# Patient Record
Sex: Female | Born: 1950 | Race: White | Hispanic: No | Marital: Single | Smoking: Former smoker
Health system: Southern US, Community
[De-identification: ages and names within clinical notes are randomized; demographics above are authoritative.]

## PROBLEM LIST (undated history)

## (undated) DIAGNOSIS — F419 Anxiety disorder, unspecified: Secondary | ICD-10-CM

## (undated) DIAGNOSIS — I10 Essential (primary) hypertension: Secondary | ICD-10-CM

## (undated) DIAGNOSIS — M1990 Unspecified osteoarthritis, unspecified site: Secondary | ICD-10-CM

## (undated) DIAGNOSIS — E785 Hyperlipidemia, unspecified: Secondary | ICD-10-CM

## (undated) HISTORY — PX: COLONOSCOPY W/ POLYPECTOMY: SHX1380

## (undated) HISTORY — PX: NO PAST SURGERIES: SHX2092

---

## 1998-12-09 ENCOUNTER — Other Ambulatory Visit: Admission: RE | Admit: 1998-12-09 | Discharge: 1998-12-09 | Payer: Self-pay | Admitting: Gynecology

## 1999-01-12 ENCOUNTER — Other Ambulatory Visit: Admission: RE | Admit: 1999-01-12 | Discharge: 1999-01-12 | Payer: Self-pay | Admitting: Gynecology

## 1999-04-30 ENCOUNTER — Other Ambulatory Visit: Admission: RE | Admit: 1999-04-30 | Discharge: 1999-04-30 | Payer: Self-pay | Admitting: Gynecology

## 1999-04-30 ENCOUNTER — Encounter (INDEPENDENT_AMBULATORY_CARE_PROVIDER_SITE_OTHER): Payer: Self-pay | Admitting: Specialist

## 1999-09-09 ENCOUNTER — Other Ambulatory Visit: Admission: RE | Admit: 1999-09-09 | Discharge: 1999-09-09 | Payer: Self-pay | Admitting: Gynecology

## 2000-03-03 ENCOUNTER — Other Ambulatory Visit: Admission: RE | Admit: 2000-03-03 | Discharge: 2000-03-03 | Payer: Self-pay | Admitting: Gynecology

## 2001-08-02 ENCOUNTER — Other Ambulatory Visit: Admission: RE | Admit: 2001-08-02 | Discharge: 2001-08-02 | Payer: Self-pay | Admitting: Family Medicine

## 2002-10-18 ENCOUNTER — Other Ambulatory Visit: Admission: RE | Admit: 2002-10-18 | Discharge: 2002-10-18 | Payer: Self-pay | Admitting: Family Medicine

## 2004-09-01 ENCOUNTER — Other Ambulatory Visit: Admission: RE | Admit: 2004-09-01 | Discharge: 2004-09-01 | Payer: Self-pay | Admitting: Family Medicine

## 2005-09-06 ENCOUNTER — Other Ambulatory Visit: Admission: RE | Admit: 2005-09-06 | Discharge: 2005-09-06 | Payer: Self-pay | Admitting: Family Medicine

## 2009-08-20 ENCOUNTER — Ambulatory Visit: Payer: Self-pay | Admitting: Oncology

## 2009-09-14 LAB — CHCC SMEAR

## 2009-09-14 LAB — CBC WITH DIFFERENTIAL/PLATELET
Eosinophils Absolute: 0.1 10*3/uL (ref 0.0–0.5)
HCT: 37.9 % (ref 34.8–46.6)
LYMPH%: 37 % (ref 14.0–49.7)
MCHC: 35.1 g/dL (ref 31.5–36.0)
MCV: 93.9 fL (ref 79.5–101.0)
MONO#: 0.3 10*3/uL (ref 0.1–0.9)
MONO%: 7.7 % (ref 0.0–14.0)
NEUT#: 2.1 10*3/uL (ref 1.5–6.5)
NEUT%: 51.2 % (ref 38.4–76.8)
Platelets: 243 10*3/uL (ref 145–400)
RBC: 4.04 10*6/uL (ref 3.70–5.45)

## 2009-09-14 LAB — COMPREHENSIVE METABOLIC PANEL
ALT: 26 U/L (ref 0–35)
Albumin: 4 g/dL (ref 3.5–5.2)
CO2: 28 mEq/L (ref 19–32)
Calcium: 9.3 mg/dL (ref 8.4–10.5)
Chloride: 105 mEq/L (ref 96–112)
Glucose, Bld: 107 mg/dL — ABNORMAL HIGH (ref 70–99)
Sodium: 138 mEq/L (ref 135–145)
Total Bilirubin: 0.7 mg/dL (ref 0.3–1.2)
Total Protein: 6.6 g/dL (ref 6.0–8.3)

## 2009-09-14 LAB — MORPHOLOGY

## 2009-09-16 LAB — PROTEIN ELECTROPHORESIS, SERUM
Albumin ELP: 64.1 % (ref 55.8–66.1)
Alpha-1-Globulin: 3.5 % (ref 2.9–4.9)
Beta 2: 4.3 % (ref 3.2–6.5)
Beta Globulin: 6.2 % (ref 4.7–7.2)

## 2010-01-08 ENCOUNTER — Ambulatory Visit: Payer: Self-pay | Admitting: Oncology

## 2010-02-10 ENCOUNTER — Ambulatory Visit: Payer: Self-pay | Admitting: Oncology

## 2010-02-11 LAB — CBC WITH DIFFERENTIAL/PLATELET
Basophils Absolute: 0 10*3/uL (ref 0.0–0.1)
Eosinophils Absolute: 0.2 10*3/uL (ref 0.0–0.5)
HGB: 13.6 g/dL (ref 11.6–15.9)
LYMPH%: 46.1 % (ref 14.0–49.7)
MCV: 91.5 fL (ref 79.5–101.0)
MONO#: 0.3 10*3/uL (ref 0.1–0.9)
MONO%: 7.6 % (ref 0.0–14.0)
NEUT#: 1.5 10*3/uL (ref 1.5–6.5)
Platelets: 202 10*3/uL (ref 145–400)
WBC: 3.8 10*3/uL — ABNORMAL LOW (ref 3.9–10.3)

## 2010-05-11 ENCOUNTER — Ambulatory Visit: Payer: Self-pay | Admitting: Oncology

## 2011-01-04 ENCOUNTER — Other Ambulatory Visit: Payer: Self-pay | Admitting: Family Medicine

## 2011-01-04 ENCOUNTER — Other Ambulatory Visit (HOSPITAL_COMMUNITY)
Admission: RE | Admit: 2011-01-04 | Discharge: 2011-01-04 | Disposition: A | Discharge status: Home / Self Care | Payer: BC Managed Care – PPO | Source: Ambulatory Visit | Attending: Family Medicine | Admitting: Family Medicine

## 2011-01-04 DIAGNOSIS — Z124 Encounter for screening for malignant neoplasm of cervix: Secondary | ICD-10-CM | POA: Insufficient documentation

## 2012-03-06 ENCOUNTER — Other Ambulatory Visit: Payer: Self-pay | Admitting: Family Medicine

## 2012-03-06 ENCOUNTER — Other Ambulatory Visit (HOSPITAL_COMMUNITY)
Admission: RE | Admit: 2012-03-06 | Discharge: 2012-03-06 | Disposition: A | Discharge status: Home / Self Care | Payer: BC Managed Care – PPO | Source: Ambulatory Visit | Attending: Family Medicine | Admitting: Family Medicine

## 2012-03-06 DIAGNOSIS — Z124 Encounter for screening for malignant neoplasm of cervix: Secondary | ICD-10-CM | POA: Insufficient documentation

## 2013-10-29 ENCOUNTER — Other Ambulatory Visit: Payer: Self-pay | Admitting: Physician Assistant

## 2013-10-29 ENCOUNTER — Ambulatory Visit
Admission: RE | Admit: 2013-10-29 | Discharge: 2013-10-29 | Disposition: A | Discharge status: Home / Self Care | Payer: BC Managed Care – PPO | Source: Ambulatory Visit | Attending: Physician Assistant | Admitting: Physician Assistant

## 2013-10-29 DIAGNOSIS — M25551 Pain in right hip: Secondary | ICD-10-CM

## 2013-12-11 ENCOUNTER — Ambulatory Visit: Payer: BC Managed Care – PPO | Admitting: Sports Medicine

## 2014-01-28 ENCOUNTER — Ambulatory Visit: Payer: BC Managed Care – PPO | Admitting: Sports Medicine

## 2015-01-11 IMAGING — CR DG HIP (WITH OR WITHOUT PELVIS) 2-3V*L*
2 series · 2 of 2 positions shown · non-contrast
Comparison: None.

CLINICAL DATA: Pain

EXAM:
LEFT HIP - COMPLETE 2+ VIEW

[t hip ap left]
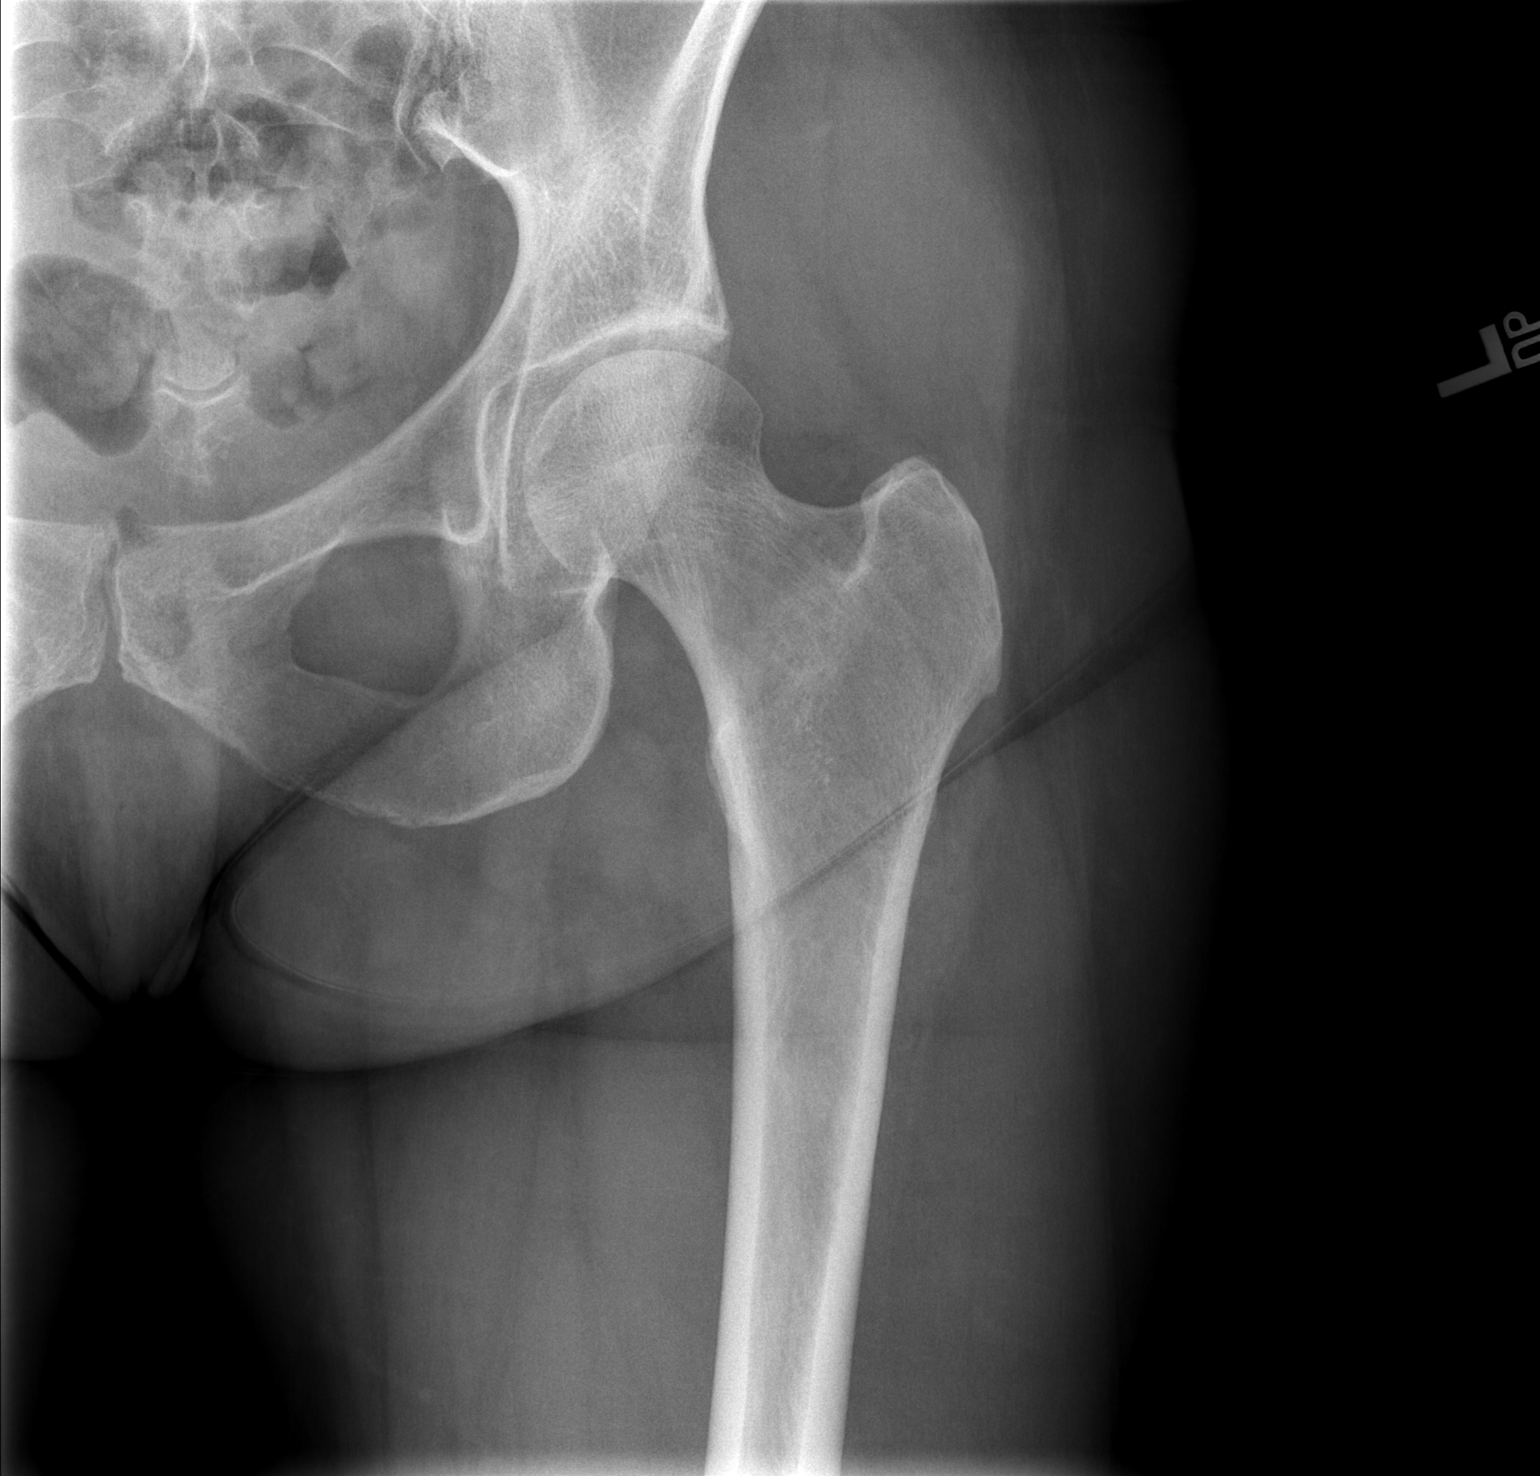

[t hip frog leg left]
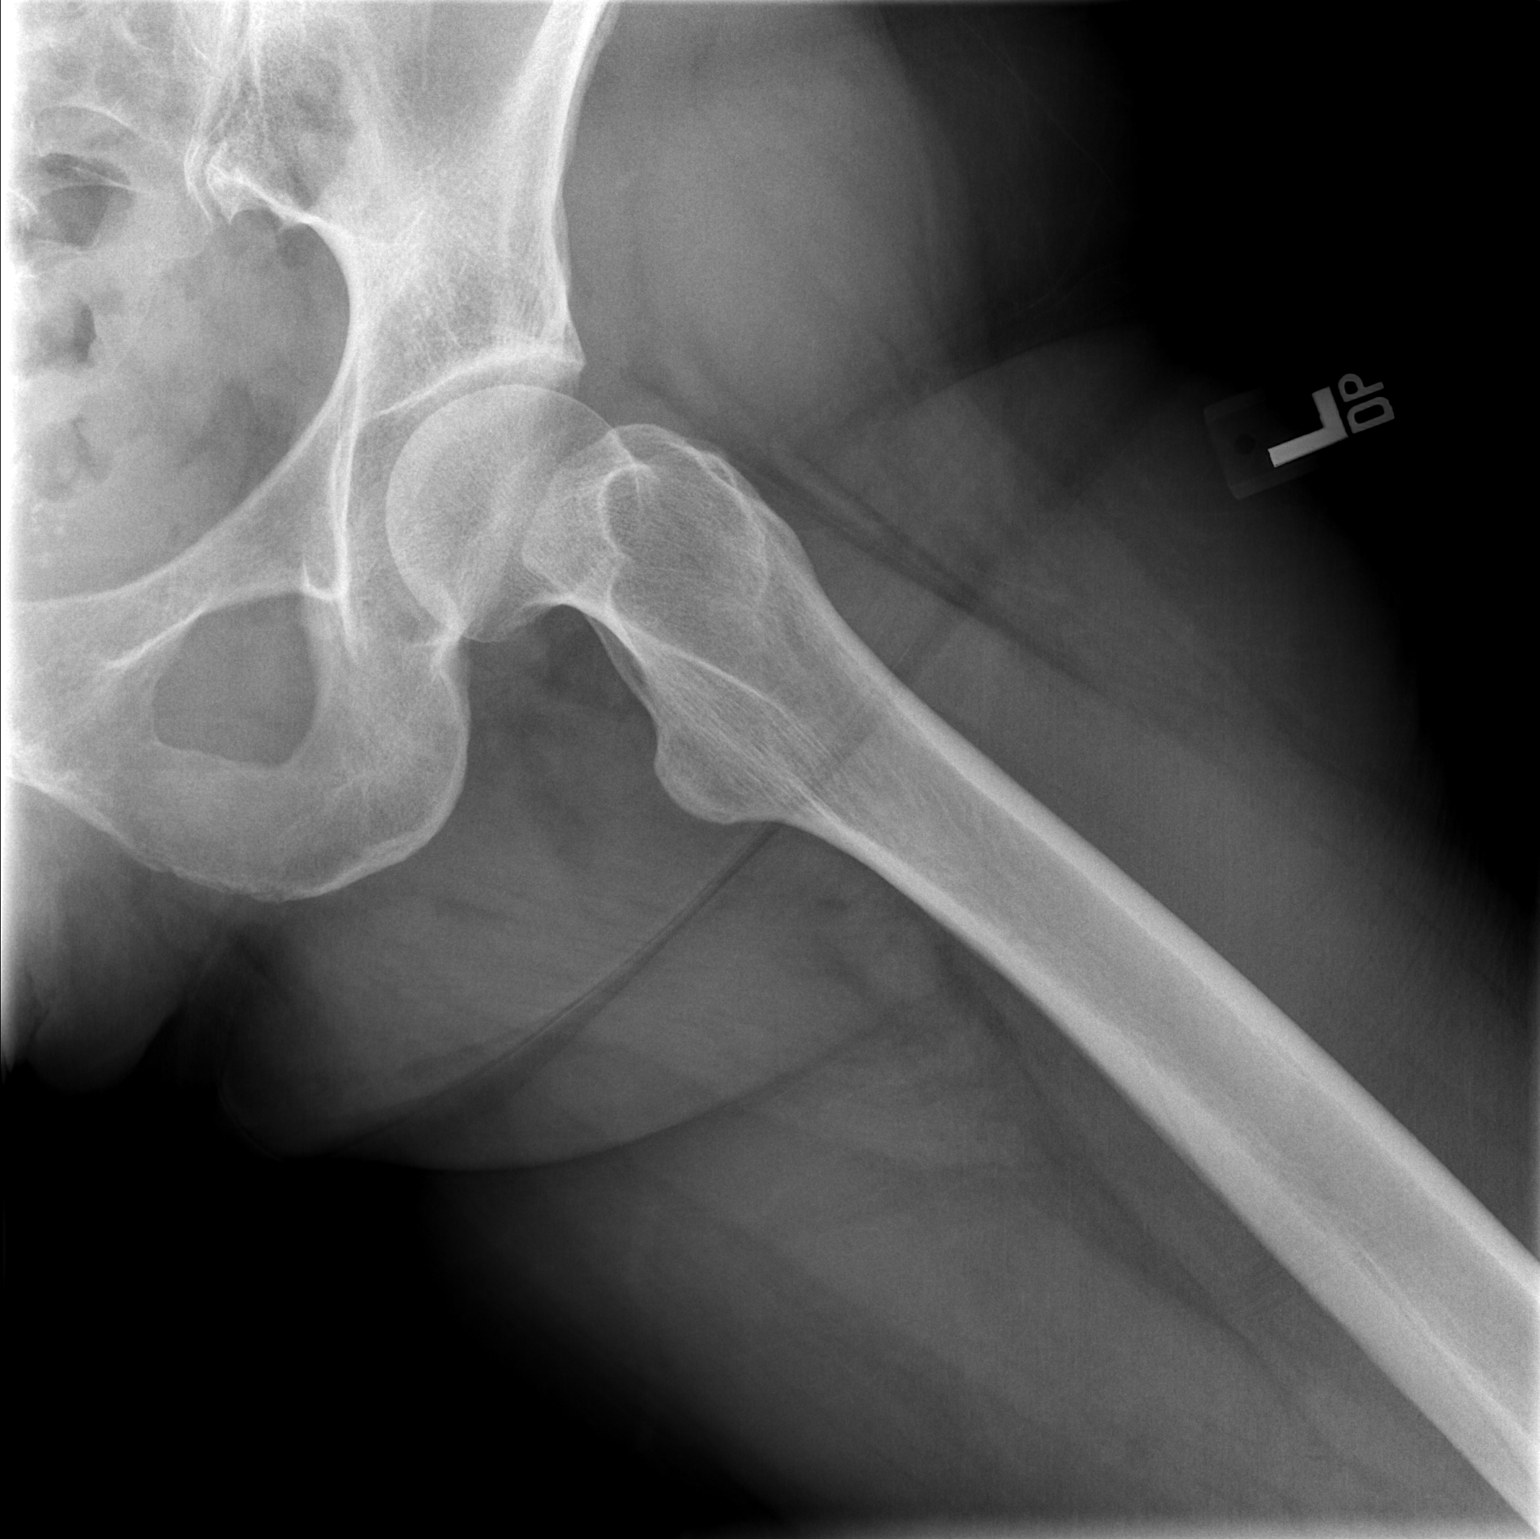

[2 of 2 positions shown; findings below may reference images not displayed]

FINDINGS: There is no evidence of hip fracture or dislocation. There is no
evidence of arthropathy or other focal bone abnormality.
IMPRESSION: Negative.

## 2016-01-29 ENCOUNTER — Other Ambulatory Visit: Payer: Self-pay | Admitting: Family Medicine

## 2016-01-29 ENCOUNTER — Other Ambulatory Visit (HOSPITAL_COMMUNITY)
Admission: RE | Admit: 2016-01-29 | Discharge: 2016-01-29 | Disposition: A | Discharge status: Home / Self Care | Payer: BC Managed Care – PPO | Source: Ambulatory Visit | Attending: Family Medicine | Admitting: Family Medicine

## 2016-01-29 DIAGNOSIS — Z124 Encounter for screening for malignant neoplasm of cervix: Secondary | ICD-10-CM | POA: Insufficient documentation

## 2016-02-03 LAB — CYTOLOGY - PAP

## 2019-12-01 ENCOUNTER — Ambulatory Visit: Payer: BC Managed Care – PPO | Attending: Internal Medicine

## 2019-12-01 DIAGNOSIS — Z23 Encounter for immunization: Secondary | ICD-10-CM | POA: Insufficient documentation

## 2019-12-01 NOTE — Progress Notes (Signed)
   Covid-19 Vaccination Clinic  Name:  Elizabeth Murillo    MRN: [id number] DOB: 1951/01/10  12/01/2019  [name] was observed post Covid-19 immunization for 15 minutes without incidence. She was provided with Vaccine Information Sheet and instruction to access the V-Safe system.   [name] was instructed to call 911 with any severe reactions post vaccine: Marland Kitchen Difficulty breathing  . Swelling of your face and throat  . A fast heartbeat  . A bad rash all over your body  . Dizziness and weakness    Immunizations Administered    Name Date Dose VIS Date Route   Pfizer COVID-19 Vaccine 12/01/2019 12:09 PM 0.3 mL 09/13/2019 Intramuscular   Manufacturer: [employer], [employer]   Lot: [id number]   NDC: [address]-1

## 2019-12-30 ENCOUNTER — Ambulatory Visit: Payer: BC Managed Care – PPO | Attending: Internal Medicine

## 2019-12-30 DIAGNOSIS — Z23 Encounter for immunization: Secondary | ICD-10-CM

## 2019-12-30 NOTE — Progress Notes (Signed)
   Covid-19 Vaccination Clinic  Name:  Lashanti Chambless    MRN: [id number] DOB: 04-19-1951  12/30/2019  Ms. Bocanegra was observed post Covid-19 immunization for 15 minutes without incident. She was provided with Vaccine Information Sheet and instruction to access the V-Safe system.   Ms. Colvin was instructed to call 911 with any severe reactions post vaccine: Marland Kitchen Difficulty breathing  . Swelling of face and throat  . A fast heartbeat  . A bad rash all over body  . Dizziness and weakness   Immunizations Administered    Name Date Dose VIS Date Route   Pfizer COVID-19 Vaccine 12/30/2019  9:10 AM 0.3 mL 09/13/2019 Intramuscular   Manufacturer: [employer], [employer]   Lot: [id number]   NDC: [address]-6      Covid-19 Vaccination Clinic  Name:  Rayn Shorb    MRN: [id number] DOB: May 12, 1951  12/30/2019  Ms. Giarrusso was observed post Covid-19 immunization for 15 minutes without incident. She was provided with Vaccine Information Sheet and instruction to access the V-Safe system.   Ms. Odeh was instructed to call 911 with any severe reactions post vaccine: Marland Kitchen Difficulty breathing  . Swelling of face and throat  . A fast heartbeat  . A bad rash all over body  . Dizziness and weakness   Immunizations Administered    Name Date Dose VIS Date Route   Pfizer COVID-19 Vaccine 12/30/2019  9:10 AM 0.3 mL 09/13/2019 Intramuscular   Manufacturer: [employer], [employer]   Lot: [id number]   NDC: [address]-6

## 2021-07-20 ENCOUNTER — Other Ambulatory Visit: Payer: Self-pay | Admitting: Radiology

## 2022-11-22 DIAGNOSIS — R269 Unspecified abnormalities of gait and mobility: Secondary | ICD-10-CM | POA: Diagnosis not present

## 2022-11-22 DIAGNOSIS — M25551 Pain in right hip: Secondary | ICD-10-CM | POA: Diagnosis not present

## 2022-11-29 DIAGNOSIS — R269 Unspecified abnormalities of gait and mobility: Secondary | ICD-10-CM | POA: Diagnosis not present

## 2022-11-29 DIAGNOSIS — M25551 Pain in right hip: Secondary | ICD-10-CM | POA: Diagnosis not present

## 2022-12-06 DIAGNOSIS — R269 Unspecified abnormalities of gait and mobility: Secondary | ICD-10-CM | POA: Diagnosis not present

## 2022-12-06 DIAGNOSIS — M25551 Pain in right hip: Secondary | ICD-10-CM | POA: Diagnosis not present

## 2022-12-27 DIAGNOSIS — M25551 Pain in right hip: Secondary | ICD-10-CM | POA: Diagnosis not present

## 2022-12-27 DIAGNOSIS — R269 Unspecified abnormalities of gait and mobility: Secondary | ICD-10-CM | POA: Diagnosis not present

## 2023-01-04 DIAGNOSIS — M25551 Pain in right hip: Secondary | ICD-10-CM | POA: Diagnosis not present

## 2023-01-04 DIAGNOSIS — R269 Unspecified abnormalities of gait and mobility: Secondary | ICD-10-CM | POA: Diagnosis not present

## 2023-01-11 DIAGNOSIS — M25551 Pain in right hip: Secondary | ICD-10-CM | POA: Diagnosis not present

## 2023-01-11 DIAGNOSIS — R269 Unspecified abnormalities of gait and mobility: Secondary | ICD-10-CM | POA: Diagnosis not present

## 2023-01-18 DIAGNOSIS — M25551 Pain in right hip: Secondary | ICD-10-CM | POA: Diagnosis not present

## 2023-01-18 DIAGNOSIS — R269 Unspecified abnormalities of gait and mobility: Secondary | ICD-10-CM | POA: Diagnosis not present

## 2023-02-01 DIAGNOSIS — R269 Unspecified abnormalities of gait and mobility: Secondary | ICD-10-CM | POA: Diagnosis not present

## 2023-02-01 DIAGNOSIS — M25551 Pain in right hip: Secondary | ICD-10-CM | POA: Diagnosis not present

## 2023-02-09 DIAGNOSIS — M25551 Pain in right hip: Secondary | ICD-10-CM | POA: Diagnosis not present

## 2023-02-09 DIAGNOSIS — R269 Unspecified abnormalities of gait and mobility: Secondary | ICD-10-CM | POA: Diagnosis not present

## 2023-02-21 DIAGNOSIS — R269 Unspecified abnormalities of gait and mobility: Secondary | ICD-10-CM | POA: Diagnosis not present

## 2023-02-21 DIAGNOSIS — M25551 Pain in right hip: Secondary | ICD-10-CM | POA: Diagnosis not present

## 2023-02-28 DIAGNOSIS — M25551 Pain in right hip: Secondary | ICD-10-CM | POA: Diagnosis not present

## 2023-02-28 DIAGNOSIS — R269 Unspecified abnormalities of gait and mobility: Secondary | ICD-10-CM | POA: Diagnosis not present

## 2023-03-08 DIAGNOSIS — M25551 Pain in right hip: Secondary | ICD-10-CM | POA: Diagnosis not present

## 2023-03-08 DIAGNOSIS — R269 Unspecified abnormalities of gait and mobility: Secondary | ICD-10-CM | POA: Diagnosis not present

## 2023-05-08 DIAGNOSIS — E7800 Pure hypercholesterolemia, unspecified: Secondary | ICD-10-CM | POA: Diagnosis not present

## 2023-05-16 DIAGNOSIS — Z8262 Family history of osteoporosis: Secondary | ICD-10-CM | POA: Diagnosis not present

## 2023-05-16 DIAGNOSIS — M8588 Other specified disorders of bone density and structure, other site: Secondary | ICD-10-CM | POA: Diagnosis not present

## 2023-05-16 DIAGNOSIS — Z1231 Encounter for screening mammogram for malignant neoplasm of breast: Secondary | ICD-10-CM | POA: Diagnosis not present

## 2023-05-24 DIAGNOSIS — M1611 Unilateral primary osteoarthritis, right hip: Secondary | ICD-10-CM | POA: Diagnosis not present

## 2023-06-01 DIAGNOSIS — D2262 Melanocytic nevi of left upper limb, including shoulder: Secondary | ICD-10-CM | POA: Diagnosis not present

## 2023-06-01 DIAGNOSIS — Z808 Family history of malignant neoplasm of other organs or systems: Secondary | ICD-10-CM | POA: Diagnosis not present

## 2023-06-01 DIAGNOSIS — L578 Other skin changes due to chronic exposure to nonionizing radiation: Secondary | ICD-10-CM | POA: Diagnosis not present

## 2023-06-01 DIAGNOSIS — L821 Other seborrheic keratosis: Secondary | ICD-10-CM | POA: Diagnosis not present

## 2023-06-01 DIAGNOSIS — Z85828 Personal history of other malignant neoplasm of skin: Secondary | ICD-10-CM | POA: Diagnosis not present

## 2023-06-01 DIAGNOSIS — D229 Melanocytic nevi, unspecified: Secondary | ICD-10-CM | POA: Diagnosis not present

## 2023-06-01 DIAGNOSIS — L570 Actinic keratosis: Secondary | ICD-10-CM | POA: Diagnosis not present

## 2023-08-07 DIAGNOSIS — E7800 Pure hypercholesterolemia, unspecified: Secondary | ICD-10-CM | POA: Diagnosis not present

## 2023-10-24 ENCOUNTER — Ambulatory Visit: Payer: Medicare PPO | Admitting: Psychology

## 2023-10-24 DIAGNOSIS — F4323 Adjustment disorder with mixed anxiety and depressed mood: Secondary | ICD-10-CM
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
## 2023-11-02 DIAGNOSIS — Z780 Asymptomatic menopausal state: Secondary | ICD-10-CM | POA: Diagnosis not present

## 2023-11-02 DIAGNOSIS — M1611 Unilateral primary osteoarthritis, right hip: Secondary | ICD-10-CM | POA: Diagnosis not present

## 2023-11-02 DIAGNOSIS — R2681 Unsteadiness on feet: Secondary | ICD-10-CM | POA: Diagnosis not present

## 2023-11-02 DIAGNOSIS — H6122 Impacted cerumen, left ear: Secondary | ICD-10-CM | POA: Diagnosis not present

## 2023-11-02 DIAGNOSIS — C4491 Basal cell carcinoma of skin, unspecified: Secondary | ICD-10-CM | POA: Diagnosis not present

## 2023-11-02 DIAGNOSIS — E7800 Pure hypercholesterolemia, unspecified: Secondary | ICD-10-CM | POA: Diagnosis not present

## 2023-11-02 DIAGNOSIS — N6019 Diffuse cystic mastopathy of unspecified breast: Secondary | ICD-10-CM | POA: Diagnosis not present

## 2023-11-02 DIAGNOSIS — M8580 Other specified disorders of bone density and structure, unspecified site: Secondary | ICD-10-CM | POA: Diagnosis not present

## 2023-11-02 DIAGNOSIS — Z0000 Encounter for general adult medical examination without abnormal findings: Secondary | ICD-10-CM | POA: Diagnosis not present

## 2023-11-06 ENCOUNTER — Ambulatory Visit: Payer: Medicare PPO | Admitting: Psychology

## 2023-11-06 DIAGNOSIS — F4323 Adjustment disorder with mixed anxiety and depressed mood: Secondary | ICD-10-CM | POA: Diagnosis not present
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
## 2023-11-13 NOTE — Progress Notes (Signed)
  [facility] [address] [place] [address] Phone: [phone] Subjective:   Elizabeth Murillo am a scribe for Dr. Katrinka Blazing.   I'm seeing this patient by the request  of:  Pcp, No  CC: Right hip pain  IRJ:[id number]  Elizabeth Murillo is a 73 y.o. female coming in with complaint of R hip pain    Right hip been in PT for several months   Patient did have some x-rays in 2015 of the hip that did not show any bony abnormality.  I have a report from a pelvic x-ray at the end of 2015 showing mild arthritic changes. Patient states has been benefiting from physical therapy and not ready to do surgery yet. Been dealing with it for years. Couple of cortisone shots in the past. Walks around 2 to 3 miles a day. Patient is also swimming. She feels like she is loosening the stiffness with her exercising. Today the hip is ok. It is a little stiffer today than yesterday. Recently started a statin. Pain does not limit activities of daily living except for reduction in how much she does. Ibu with breakfast but no more throughout the pain. Social History   Socioeconomic History   Marital status: Single    Spouse name: Not on file   Number of children: Not on file   Years of education: Not on file   Highest education level: Not on file  Occupational History   Not on file  Tobacco Use   Smoking status: Not on file   Smokeless tobacco: Not on file  Substance and Sexual Activity   Alcohol use: Not on file   Drug use: Not on file   Sexual activity: Not on file  Other Topics Concern   Not on file  Social History Narrative   Not on file   Social Drivers of Health   Financial Resource Strain: Not on file  Food Insecurity: Not on file  Transportation Needs: Not on file  Physical Activity: Not on file  Stress: Not on file  Social Connections: Not on file   Not on File No family history on file. No current outpatient medications on file.    Objective  Blood  pressure (!) 150/70, pulse 61, height 5' 4.17" (1.63 m), weight 149 lb (67.6 kg), SpO2 97%.   General: No apparent distress alert and oriented x3 mood and affect normal, dressed appropriately.  HEENT: Pupils equal, extraocular movements intact  Respiratory: Patient's speak in full sentences and does not appear short of breath  Cardiovascular: No lower extremity edema, non tender, no erythema   Antalgic gait noted.  Right hip has no significant internal extra range of motion and patient does ambulate with a mildly externally right rotated foot    Impression and Recommendations:    The above documentation has been reviewed and is accurate and complete Judi Saa, DO

## 2023-11-14 ENCOUNTER — Ambulatory Visit (INDEPENDENT_AMBULATORY_CARE_PROVIDER_SITE_OTHER): Payer: Medicare PPO

## 2023-11-14 ENCOUNTER — Ambulatory Visit: Payer: Medicare PPO | Admitting: Family Medicine

## 2023-11-14 VITALS — BP 150/70 | HR 61 | Ht 64.17 in | Wt 149.0 lb

## 2023-11-14 DIAGNOSIS — M25551 Pain in right hip: Secondary | ICD-10-CM

## 2023-11-14 DIAGNOSIS — M438X6 Other specified deforming dorsopathies, lumbar region: Secondary | ICD-10-CM | POA: Diagnosis not present

## 2023-11-14 DIAGNOSIS — M1611 Unilateral primary osteoarthritis, right hip: Secondary | ICD-10-CM

## 2023-11-14 DIAGNOSIS — M47816 Spondylosis without myelopathy or radiculopathy, lumbar region: Secondary | ICD-10-CM | POA: Diagnosis not present

## 2023-11-14 NOTE — Patient Instructions (Signed)
Referral to [facility] Dr. Jerl Santos.  I'm here. Please keep me informed about what is going on.

## 2023-11-14 NOTE — Assessment & Plan Note (Signed)
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  We discussed with patient that I do not think anything less than a potential replacement would be beneficial for her quality of life.  Patient has been making good strides on her own but I do think at this point it is stopping her from daily activities, things with family and is even waking her up at night.  Patient has a great BMI and would be a great candidate for a anterior replacement and will be referred accordingly

## 2023-11-20 ENCOUNTER — Ambulatory Visit: Payer: Medicare PPO | Admitting: Psychology

## 2023-11-20 DIAGNOSIS — F4323 Adjustment disorder with mixed anxiety and depressed mood: Secondary | ICD-10-CM | POA: Diagnosis not present

## 2023-11-20 NOTE — Progress Notes (Signed)
[facility] Counselor/Therapist Progress Note  Patient ID: Elizabeth Murillo, MRN: [id number],    Date: 11/20/2023  Time Spent: 5:00pm-5:55pm   55 minutes   Treatment Type: Individual Therapy  Reported Symptoms: stress, worrying  Mental Status Exam: Appearance:  Casual     Behavior: Appropriate  Motor: Normal  Speech/Language:  Normal Rate  Affect: Appropriate  Mood: normal  Thought process: normal  Thought content:   WNL  Sensory/Perceptual disturbances:   WNL  Orientation: oriented to person, place, time/date, and situation  Attention: Good  Concentration: Good  Memory: WNL  Fund of knowledge:  Good  Insight:   Good  Judgment:  Good  Impulse Control: Good   Risk Assessment: Danger to Self:  No Self-injurious Behavior: No Danger to Others: No Duty to Warn:no Physical Aggression / Violence:No  Access to Firearms a concern: No  Gang Involvement:No   Subjective: Pt present for face-to-face individual therapy via video.  Pt consents to telehealth video session and is aware of limitations and benefits of virtual sessions.   Location of pt: home Location of therapist: home office.   Pt talked about going to a protest in [place] today.  She was glad she went and is trying to do all she can do to make a difference in this challenging political climate. 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  She met with the new editor and feels she is developing a positive relationship and that they are on the same page.   Pt is still upset with reader #2 who has delayed her book getting published for a year.  Pt's publication won't be until 2027. Pt has had feelings of being "just a librarian".   She wants to work on this self esteem issue the next session.   Encouraged pt to journal about this.   Pt talked about her health.   She saw an MD about her hip and was told she needs a hip replacement.   Pt was referred to a surgeon and hope to get the surgery within the next month.   Pt feels the  timing is right now and even though she has some anxiety about surgery she is feeling positive about being able to do things like go hiking once she recovers.  Pt talked about wanting to move up [place] to be near her niece Elizabeth Murillo.   Elizabeth Murillo was 71 years old when pt's sister died of a brain tumor.   Pt has been a very close aunt who helped Elizabeth Murillo through that difficult loss.   Elizabeth Murillo is moving to [place] and pt want to move near her possibly later this year.   Worked on self care strategies.  Pt has been swimming and playing guitar.  She also does daily meditation.    Provided supportive therapy.     Interventions: Cognitive Behavioral Therapy and Insight-Oriented  Diagnosis:  F43.23  Plan of Care: Recommend ongoing therapy.  Pt participated in setting treatment goals.  Pt wants to talk through life stressors.  She wants to figure out what her next project will be.  Pt wants help managing anxiety about politics.   Pt [place]  Pt gets caught up in "what if" thinking about fears and wants to work on thought reframing.   Plan to meet every two weeks.  Pt agrees with treatment plan.    Treatment Plan Client Abilities/Strengths  Pt is bright, engaging, and motivated for therapy.  Client Treatment Preferences  Individual therapy.  Client Statement of Needs  Improve copings skills.  Addressed life stressors. Symptoms  Depressed or irritable mood. Excessive and/or unrealistic worry that is difficult to control occurring more days than not for at least 6 months about a number of events or activities. Hypervigilance (e.g., feeling constantly on edge, experiencing concentration difficulties, having trouble falling or staying asleep, exhibiting a general state of irritability). Low self-esteem. Problems Addressed  Unipolar Depression, Anxiety Goals 1. Alleviate depressive symptoms and return to previous level of effective functioning. 2. Appropriately grieve the loss in order to  normalize mood and to return to previously adaptive level of functioning. Objective Learn and implement behavioral strategies to overcome depression. Target Date: 2024-10-23 Frequency: Biweekly  Progress: 10 Modality: individual  Related Interventions Assist the client in developing skills that increase the likelihood of deriving pleasure from behavioral activation (e.g., assertiveness skills, developing an exercise plan, less internal/more external focus, increased social involvement); reinforce success. Engage the client in "behavioral activation," increasing his/her activity level and contact with sources of reward, while identifying processes that inhibit activation. use behavioral techniques such as instruction, rehearsal, role-playing, role reversal, as needed, to facilitate activity in the client's daily life; reinforce success. 3. Develop healthy interpersonal relationships that lead to the alleviation and help prevent the relapse of depression. 4. Develop healthy thinking patterns and beliefs about self, others, and the world that lead to the alleviation and help prevent the relapse of depression. 5. Enhance ability to effectively cope with the full variety of life's worries and anxieties. 6. Learn and implement coping skills that result in a reduction of anxiety and worry, and improved daily functioning. Objective Learn and implement problem-solving strategies for realistically addressing worries. Target Date: 2024-10-23 Frequency: Biweekly  Progress: 10 Modality: individual  Related Interventions Assign the client a homework exercise in which he/she problem-solves a current problem (see Mastery of Your Anxiety and Worry: Workbook by Elenora Fender and Filbert Schilder or Generalized Anxiety Disorder by Elesa Hacker, and Filbert Schilder); review, reinforce success, and provide corrective feedback toward improvement. Teach the client problem-solving strategies involving specifically defining a problem, generating  options for addressing it, evaluating the pros and cons of each option, selecting and implementing an optional action, and reevaluating and refining the action. Objective Learn and implement calming skills to reduce overall anxiety and manage anxiety symptoms. Target Date: 2024-10-23 Frequency: Biweekly  Progress: 10 Modality: individual  Related Interventions Assign the client to read about progressive muscle relaxation and other calming strategies in relevant books or treatment manuals (e.g., Progressive Relaxation Training by Twana First; Mastery of Your Anxiety and Worry: Workbook by Earlie Counts). Assign the client homework each session in which he/she practices relaxation exercises daily, gradually applying them progressively from non-anxiety-provoking to anxiety-provoking situations; review and reinforce success while providing corrective feedback toward improvement. Teach the client calming/relaxation skills (e.g., applied relaxation, progressive muscle relaxation, cue controlled relaxation; mindful breathing; biofeedback) and how to discriminate better between relaxation and tension; teach the client how to apply these skills to his/her daily life. 7. Recognize, accept, and cope with feelings of depression. 8. Reduce overall frequency, intensity, and duration of the anxiety so that daily functioning is not impaired. 9. Resolve the core conflict that is the source of anxiety. 10. Stabilize anxiety level while increasing ability to function on a daily basis. Diagnosis F43.23  Salomon Fick, LCSW

## 2023-11-22 DIAGNOSIS — M25551 Pain in right hip: Secondary | ICD-10-CM | POA: Diagnosis not present

## 2023-12-11 ENCOUNTER — Other Ambulatory Visit: Payer: Self-pay | Admitting: Orthopaedic Surgery

## 2023-12-11 DIAGNOSIS — M1611 Unilateral primary osteoarthritis, right hip: Secondary | ICD-10-CM | POA: Diagnosis not present

## 2023-12-14 ENCOUNTER — Ambulatory Visit: Payer: Medicare PPO | Admitting: Psychology

## 2023-12-14 DIAGNOSIS — F4323 Adjustment disorder with mixed anxiety and depressed mood: Secondary | ICD-10-CM

## 2023-12-14 NOTE — Progress Notes (Signed)
 [facility] Counselor/Therapist Progress Note  Patient ID: Elizabeth Murillo, MRN: [id number],    Date: 12/14/2023  Time Spent: 4:00pm-4:55pm   55 minutes   Treatment Type: Individual Therapy  Reported Symptoms: anxiety, worrying  Mental Status Exam: Appearance:  Casual     Behavior: Appropriate  Motor: Normal  Speech/Language:  Normal Rate  Affect: Appropriate  Mood: normal  Thought process: normal  Thought content:   WNL  Sensory/Perceptual disturbances:   WNL  Orientation: oriented to person, place, time/date, and situation  Attention: Good  Concentration: Good  Memory: WNL  Fund of knowledge:  Good  Insight:   Good  Judgment:  Good  Impulse Control: Good   Risk Assessment: Danger to Self:  No Self-injurious Behavior: No Danger to Others: No Duty to Warn:no Physical Aggression / Violence:No  Access to Firearms a concern: No  Gang Involvement:No   Subjective: Pt present for face-to-face individual therapy via video.  Pt consents to telehealth video session and is aware of limitations and benefits of virtual sessions.   Location of pt: home Location of therapist: home office.   Pt talked about her upcoming hip replacement surgery that is scheduled for April 1st.  She has felt fearful and anxious about the surgery bc this is the first time she has ever had surgery.   Addressed the "what if" worry thoughts that are triggering pt's anxiety.   Worked on thought reframing.   Worked on calming strategies. Pt states it is also hard for her to ask for help and she has needed to ask for help when she recuperates from surgery.   Pt will stay in the hospital overnight and then asked one friend to drive her home and another friend to stay with her for a few days.   Pt is use to being independent and not asking for help.   Helped pt normalize the need for help in this situation.  Pt states she does not feel deserving of help bc of her self esteem.  Worked on self esteem  issues.   Pt talked about feeling anxious about the political situation and state of the country. Worked on self care strategies.   Provided supportive therapy.     Interventions: Cognitive Behavioral Therapy and Insight-Oriented  Diagnosis:  F43.23  Plan of Care: Recommend ongoing therapy.  Pt participated in setting treatment goals.  Pt wants to talk through life stressors.  She wants to figure out what her next project will be.  Pt wants help managing anxiety about politics.   Pt wants help [place]  Pt gets caught up in "what if" thinking about fears and wants to work on thought reframing.   Plan to meet every two weeks.  Pt agrees with treatment plan.    Treatment Plan Client Abilities/Strengths  Pt is bright, engaging, and motivated for therapy.  Client Treatment Preferences  Individual therapy.  Client Statement of Needs  Improve copings skills.  Addressed life stressors. Symptoms  Depressed or irritable mood. Excessive and/or unrealistic worry that is difficult to control occurring more days than not for at least 6 months about a number of events or activities. Hypervigilance (e.g., feeling constantly on edge, experiencing concentration difficulties, having trouble falling or staying asleep, exhibiting a general state of irritability). Low self-esteem. Problems Addressed  Unipolar Depression, Anxiety Goals 1. Alleviate depressive symptoms and return to previous level of effective functioning. 2. Appropriately grieve the loss in order to normalize mood and to return  to previously adaptive level of functioning. Objective Learn and implement behavioral strategies to overcome depression. Target Date: 2024-10-23 Frequency: Biweekly  Progress: 10 Modality: individual  Related Interventions Assist the client in developing skills that increase the likelihood of deriving pleasure from behavioral activation (e.g., assertiveness skills, developing an exercise plan,  less internal/more external focus, increased social involvement); reinforce success. Engage the client in "behavioral activation," increasing his/her activity level and contact with sources of reward, while identifying processes that inhibit activation. use behavioral techniques such as instruction, rehearsal, role-playing, role reversal, as needed, to facilitate activity in the client's daily life; reinforce success. 3. Develop healthy interpersonal relationships that lead to the alleviation and help prevent the relapse of depression. 4. Develop healthy thinking patterns and beliefs about self, others, and the world that lead to the alleviation and help prevent the relapse of depression. 5. Enhance ability to effectively cope with the full variety of life's worries and anxieties. 6. Learn and implement coping skills that result in a reduction of anxiety and worry, and improved daily functioning. Objective Learn and implement problem-solving strategies for realistically addressing worries. Target Date: 2024-10-23 Frequency: Biweekly  Progress: 10 Modality: individual  Related Interventions Assign the client a homework exercise in which he/she problem-solves a current problem (see Mastery of Your Anxiety and Worry: Workbook by Elenora Fender and Filbert Schilder or Generalized Anxiety Disorder by Elesa Hacker, and Filbert Schilder); review, reinforce success, and provide corrective feedback toward improvement. Teach the client problem-solving strategies involving specifically defining a problem, generating options for addressing it, evaluating the pros and cons of each option, selecting and implementing an optional action, and reevaluating and refining the action. Objective Learn and implement calming skills to reduce overall anxiety and manage anxiety symptoms. Target Date: 2024-10-23 Frequency: Biweekly  Progress: 10 Modality: individual  Related Interventions Assign the client to read about progressive muscle relaxation  and other calming strategies in relevant books or treatment manuals (e.g., Progressive Relaxation Training by Twana First; Mastery of Your Anxiety and Worry: Workbook by Earlie Counts). Assign the client homework each session in which he/she practices relaxation exercises daily, gradually applying them progressively from non-anxiety-provoking to anxiety-provoking situations; review and reinforce success while providing corrective feedback toward improvement. Teach the client calming/relaxation skills (e.g., applied relaxation, progressive muscle relaxation, cue controlled relaxation; mindful breathing; biofeedback) and how to discriminate better between relaxation and tension; teach the client how to apply these skills to his/her daily life. 7. Recognize, accept, and cope with feelings of depression. 8. Reduce overall frequency, intensity, and duration of the anxiety so that daily functioning is not impaired. 9. Resolve the core conflict that is the source of anxiety. 10. Stabilize anxiety level while increasing ability to function on a daily basis. Diagnosis F43.23  Salomon Fick, LCSW

## 2023-12-20 NOTE — Progress Notes (Signed)
 COVID Vaccine received:  []  No [x]  Yes Date of any COVID positive Test in last 90 days:  PCP - Ardean Larsen, MD at [facility] [phone]  Cardiologist -   Chest x-ray -  EKG -  will do at [facility] Stress Test -  ECHO -  Cardiac Cath -   PCR screen: [x]  Ordered & Completed []   No Order but Needs PROFEND     []   N/A for this surgery  Surgery Plan:  []  Ambulatory   [x]  Outpatient in bed  []  Admit Anesthesia:    []  General  [x]  Spinal  []   Choice []   MAC  Pacemaker / ICD device [x]  No []  Yes   Spinal Cord Stimulator:[x]  No []  Yes       History of Sleep Apnea? [x]  No []  Yes   CPAP used?- [x]  No []  Yes    Does the patient monitor blood sugar?   [x]  N/A   []  No []  Yes  Patient has: [x]  NO Hx DM   []  Pre-DM   []  DM1  []   DM2 Last A1c was:        on       Blood Thinner / Instructions:  none Aspirin Instructions:  none  ERAS Protocol Ordered: []  No  [x]  Yes PRE-SURGERY [x]  ENSURE  []  G2  Patient is to be NPO after: 0700  Dental hx: []  Dentures:  []  N/A      []  Bridge or Partial:                   []  Loose or Damaged teeth:   Comments: Patient was given the 5 CHG shower / bath instructions for THA surgery along with 2 bottles of the CHG soap. Patient will start this on:12-29-2023 All questions were asked and answered, Patient voiced understanding of this process.   Activity level: Patient is able / unable to climb a flight of stairs without difficulty; []  No CP  []  No SOB, but would have ___   Patient can / can not perform ADLs without assistance.   Anesthesia review: HLD, HTN- no meds, no other pertinent medical or surgical history,  Patient denies shortness of breath, fever, cough and chest pain at PAT appointment.  Patient verbalized understanding and agreement to the Pre-Surgical Instructions that were given to them at this PAT appointment. Patient was also educated of the need to review these PAT instructions again prior to her surgery.I reviewed the appropriate phone numbers  to call if they have any and questions or concerns.

## 2023-12-20 NOTE — Patient Instructions (Signed)
 SURGICAL WAITING ROOM VISITATION Patients having surgery or a procedure may have no more than 2 support people in the waiting area - these visitors may rotate in the visitor waiting room.   Due to an increase in RSV and influenza rates and associated hospitalizations, children ages 16 and under may not visit patients in [facility] hospitals. If the patient needs to stay at the hospital during part of their recovery, the visitor guidelines for inpatient rooms apply.  PRE-OP VISITATION  Pre-op nurse will coordinate an appropriate time for 1 support person to accompany the patient in pre-op.  This support person may not rotate.  This visitor will be contacted when the time is appropriate for the visitor to come back in the pre-op area.  Please refer to the [facility] website for the visitor guidelines for Inpatients (after your surgery is over and you are in a regular room).  You are not required to quarantine at this time prior to your surgery. However, you must do this: Hand Hygiene often Do NOT share personal items Notify your provider if you are in close contact with someone who has COVID or you develop fever 100.4 or greater, new onset of sneezing, cough, sore throat, shortness of breath or body aches.  If you test positive for Covid or have been in contact with anyone that has tested positive in the last 10 days please notify you surgeon.    Your procedure is scheduled on:  TUESDAY  January 02, 2024  Report to [facility] Main Entrance: [facility]sen entrance where the [employer] is available.   Report to admitting at: 07:30    AM  Call this number if you have any questions or problems the morning of surgery [phone]  Do not eat food after Midnight the night prior to your surgery/procedure.  After Midnight you may have the following liquids until  07:00  AM DAY OF SURGERY  Clear Liquid Diet Water Black Coffee (sugar ok, NO MILK/CREAM OR CREAMERS)  Tea (sugar ok, NO  MILK/CREAM OR CREAMERS) regular and decaf                             Plain Jell-O  with no fruit (NO RED)                                           Fruit ices (not with fruit pulp, NO RED)                                     Popsicles (NO RED)                                                                  Juice: NO CITRUS JUICES: only apple, WHITE grape, WHITE cranberry Sports drinks like Gatorade or Powerade (NO RED)                   The day of surgery:  Drink ONE (1) Pre-Surgery Clear Ensure  at  07:00 AM the morning of  surgery. Drink in one sitting. Do not sip.  This drink was given to you during your hospital pre-op appointment visit. Nothing else to drink after completing the Pre-Surgery Clear Ensure : No candy, chewing gum or throat lozenges.    FOLLOW ANY ADDITIONAL PRE OP INSTRUCTIONS YOU RECEIVED FROM YOUR SURGEON'S OFFICE!!!   Oral Hygiene is also important to reduce your risk of infection.        Remember - BRUSH YOUR TEETH THE MORNING OF SURGERY WITH YOUR REGULAR TOOTHPASTE  Do NOT smoke after Midnight the night before surgery.  STOP TAKING all Vitamins, Herbs and supplements 1 week before your surgery.   Take ONLY these medicines the morning of surgery with A SIP OF WATER: none                   You may not have any metal on your body including hair pins, jewelry, and body piercing  Do not wear make-up, lotions, powders, perfumes or deodorant  Do not wear nail polish including gel and S&S, artificial / acrylic nails, or any other type of covering on natural nails including finger and toenails. If you have artificial nails, gel coating, etc., that needs to be removed by a nail salon, Please have this removed prior to surgery. Not doing so may mean that your surgery could be cancelled or delayed if the Surgeon or anesthesia staff feels like they are unable to monitor you safely.   Do not shave 48 hours prior to surgery to avoid nicks in your skin which may contribute  to postoperative infections.   Contacts, Hearing Aids, dentures or bridgework may not be worn into surgery. DENTURES WILL BE REMOVED PRIOR TO SURGERY PLEASE DO NOT APPLY "Poly grip" OR ADHESIVES!!!  You may bring a small overnight bag with you on the day of surgery, only pack items that are not valuable. [place] IS NOT RESPONSIBLE   FOR VALUABLES THAT ARE LOST OR STOLEN.   Do not bring your home medications to the hospital. The Pharmacy will dispense medications listed on your medication list to you during your admission in the Hospital.  Please read over the following fact sheets you were given: IF YOU HAVE QUESTIONS ABOUT YOUR PRE-OP INSTRUCTIONS, PLEASE CALL [phone].     Pre-operative 5 CHG Bath Instructions   You can play a key role in reducing the risk of infection after surgery. Your skin needs to be as free of germs as possible. You can reduce the number of germs on your skin by washing with CHG (chlorhexidine gluconate) soap before surgery. CHG is an antiseptic soap that kills germs and continues to kill germs even after washing.   DO NOT use if you have an allergy to chlorhexidine/CHG or antibacterial soaps. If your skin becomes reddened or irritated, stop using the CHG and notify one of our RNs at [phone]  Please shower with the CHG soap starting 4 days before surgery using the following schedule: START SHOWERS ON   FRIDAY  December 29, 2023  Please keep in mind the following:  DO NOT shave, including legs and underarms, starting the day of your first shower.   You may shave your face at any point before/day of surgery.   Place clean sheets on your bed the day you start using CHG soap. Use a clean washcloth (not used since being washed) for each shower. DO NOT sleep with pets once you start using  the CHG.   CHG Shower Instructions:  If you choose to wash your hair and private area, wash first with your normal shampoo/soap.  After you use shampoo/soap, rinse your hair and body thoroughly to remove shampoo/soap residue.  Turn the water OFF and apply about 3 tablespoons (45 ml) of CHG soap to a CLEAN washcloth.  Apply CHG soap ONLY FROM YOUR NECK DOWN TO YOUR TOES (washing for 3-5 minutes)  DO NOT use CHG soap on face, private areas, open wounds, or sores.  Pay special attention to the area where your surgery is being performed.  If you are having back surgery, having someone wash your back for you may be helpful.  Wait 2 minutes after CHG soap is applied, then you may rinse off the CHG soap.  Pat dry with a clean towel  Put on clean clothes/pajamas   If you choose to wear lotion, please use ONLY the CHG-compatible lotions on the back of this paper.     Additional instructions for the day of surgery: DO NOT APPLY any lotions, deodorants, cologne, or perfumes.   Put on clean/comfortable clothes.  Brush your teeth.  Ask your nurse before applying any prescription medications to the skin.      CHG Compatible Lotions   Aveeno Moisturizing lotion  Cetaphil Moisturizing Cream  Cetaphil Moisturizing Lotion  Clairol Herbal Essence Moisturizing Lotion, Dry Skin  Clairol Herbal Essence Moisturizing Lotion, Extra Dry Skin  Clairol Herbal Essence Moisturizing Lotion, Normal Skin  Curel Age Defying Therapeutic Moisturizing Lotion with Alpha Hydroxy  Curel Extreme Care Body Lotion  Curel Soothing Hands Moisturizing Hand Lotion  Curel Therapeutic Moisturizing Cream, Fragrance-Free  Curel Therapeutic Moisturizing Lotion, Fragrance-Free  Curel Therapeutic Moisturizing Lotion, Original Formula  Eucerin Daily Replenishing Lotion  Eucerin Dry Skin Therapy Plus Alpha Hydroxy Crme  Eucerin Dry Skin Therapy Plus Alpha Hydroxy Lotion  Eucerin Original Crme  Eucerin Original Lotion   Eucerin Plus Crme Eucerin Plus Lotion  Eucerin TriLipid Replenishing Lotion  Keri Anti-Bacterial Hand Lotion  Keri Deep Conditioning Original Lotion Dry Skin Formula Softly Scented  Keri Deep Conditioning Original Lotion, Fragrance Free Sensitive Skin Formula  Keri Lotion Fast Absorbing Fragrance Free Sensitive Skin Formula  Keri Lotion Fast Absorbing Softly Scented Dry Skin Formula  Keri Original Lotion  Keri Skin Renewal Lotion Keri Silky Smooth Lotion  Keri Silky Smooth Sensitive Skin Lotion  Nivea Body Creamy Conditioning Oil  Nivea Body Extra Enriched Lotion  Nivea Body Original Lotion  Nivea Body Sheer Moisturizing Lotion Nivea Crme  Nivea Skin Firming Lotion  NutraDerm 30 Skin Lotion  NutraDerm Skin Lotion  NutraDerm Therapeutic Skin Cream  NutraDerm Therapeutic Skin Lotion  ProShield Protective Hand Cream  Provon moisturizing lotion   FAILURE TO FOLLOW THESE INSTRUCTIONS MAY RESULT IN THE CANCELLATION OF YOUR SURGERY  PATIENT SIGNATURE_________________________________  NURSE SIGNATURE__________________________________  ________________________________________________________________________        Rogelia Mire    An incentive spirometer is a tool that can help keep your lungs clear and active. This tool measures how well you are filling your lungs with each breath.  Taking long deep breaths may help reverse or decrease the chance of developing breathing (pulmonary) problems (especially infection) following: A long period of time when you are unable to move or be active. BEFORE THE PROCEDURE  If the spirometer includes an indicator to show your best effort, your nurse or respiratory therapist will set it to a desired goal. If possible, sit up straight or lean slightly forward. Try not to slouch. Hold the incentive spirometer in an upright position. INSTRUCTIONS FOR USE  Sit on the edge of your bed if possible, or sit up as far as you can in bed or on a  chair. Hold the incentive spirometer in an upright position. Breathe out normally. Place the mouthpiece in your mouth and seal your lips tightly around it. Breathe in slowly and as deeply as possible, raising the piston or the ball toward the top of the column. Hold your breath for 3-5 seconds or for as long as possible. Allow the piston or ball to fall to the bottom of the column. Remove the mouthpiece from your mouth and breathe out normally. Rest for a few seconds and repeat Steps 1 through 7 at least 10 times every 1-2 hours when you are awake. Take your time and take a few normal breaths between deep breaths. The spirometer may include an indicator to show your best effort. Use the indicator as a goal to work toward during each repetition. After each set of 10 deep breaths, practice coughing to be sure your lungs are clear. If you have an incision (the cut made at the time of surgery), support your incision when coughing by placing a pillow or rolled up towels firmly against it. Once you are able to get out of bed, walk around indoors and cough well. You may stop using the incentive spirometer when instructed by your caregiver.  RISKS AND COMPLICATIONS Take your time so you do not get dizzy or light-headed. If you are in pain, you may need to take or ask for pain medication before doing incentive spirometry. It is harder to take a deep breath if you are having pain. AFTER USE Rest and breathe slowly and easily. It can be helpful to keep track of a log of your progress. Your caregiver can provide you with a simple table to help with this. If you are using the spirometer at home, follow these instructions: SEEK MEDICAL CARE IF:  You are having difficultly using the spirometer. You have trouble using the spirometer as often as instructed. Your pain medication is not giving enough relief while using the spirometer. You develop fever of 100.5 F (38.1 C) or higher.                                                                                                     SEEK IMMEDIATE MEDICAL CARE IF:  You cough up bloody sputum that had not been present before. You develop fever of 102 F (38.9 C) or greater. You develop worsening pain at or near the incision site. MAKE SURE YOU:  Understand these instructions. Will watch your  condition. Will get help right away if you are not doing well or get worse. Document Released: 01/30/2007 Document Revised: 12/12/2011 Document Reviewed: 04/02/2007 [facility] Patient Information 2014 [place], [place].        WHAT IS A BLOOD TRANSFUSION? Blood Transfusion Information  A transfusion is the replacement of blood or some of its parts. Blood is made up of multiple cells which provide different functions. Red blood cells carry oxygen and are used for blood loss replacement. White blood cells fight against infection. Platelets control bleeding. Plasma helps clot blood. Other blood products are available for specialized needs, such as hemophilia or other clotting disorders. BEFORE THE TRANSFUSION  Who gives blood for transfusions?  Healthy volunteers who are fully evaluated to make sure their blood is safe. This is blood bank blood. Transfusion therapy is the safest it has ever been in the practice of medicine. Before blood is taken from a donor, a complete history is taken to make sure that person has no history of diseases nor engages in risky social behavior (examples are intravenous drug use or sexual activity with multiple partners). The donor's travel history is screened to minimize risk of transmitting infections, such as malaria. The donated blood is tested for signs of infectious diseases, such as HIV and hepatitis. The blood is then tested to be sure it is compatible with you in order to minimize the chance of a transfusion reaction. If you or a relative donates blood, this is often done in anticipation of surgery and is not appropriate for  emergency situations. It takes many days to process the donated blood. RISKS AND COMPLICATIONS Although transfusion therapy is very safe and saves many lives, the main dangers of transfusion include:  Getting an infectious disease. Developing a transfusion reaction. This is an allergic reaction to something in the blood you were given. Every precaution is taken to prevent this. The decision to have a blood transfusion has been considered carefully by your caregiver before blood is given. Blood is not given unless the benefits outweigh the risks. AFTER THE TRANSFUSION Right after receiving a blood transfusion, you will usually feel much better and more energetic. This is especially true if your red blood cells have gotten low (anemic). The transfusion raises the level of the red blood cells which carry oxygen, and this usually causes an energy increase. The nurse administering the transfusion will monitor you carefully for complications. HOME CARE INSTRUCTIONS  No special instructions are needed after a transfusion. You may find your energy is better. Speak with your caregiver about any limitations on activity for underlying diseases you may have. SEEK MEDICAL CARE IF:  Your condition is not improving after your transfusion. You develop redness or irritation at the intravenous (IV) site. SEEK IMMEDIATE MEDICAL CARE IF:  Any of the following symptoms occur over the next 12 hours: Shaking chills. You have a temperature by mouth above 102 F (38.9 C), not controlled by medicine. Chest, back, or muscle pain. People around you feel you are not acting correctly or are confused. Shortness of breath or difficulty breathing. Dizziness and fainting. You get a rash or develop hives. You have a decrease in urine output. Your urine turns a dark color or changes to pink, red, or brown. Any of the following symptoms occur over the next 10 days: You have a temperature by mouth above 102 F (38.9 C), not  controlled by medicine. Shortness of breath. Weakness after normal activity. The white part of the eye turns yellow (jaundice).  You have a decrease in the amount of urine or are urinating less often. Your urine turns a dark color or changes to pink, red, or brown. Document Released: 09/16/2000 Document Revised: 12/12/2011 Document Reviewed: 05/05/2008 [facility] Patient Information 2014 [place], [place].  _______________________________________________________________________         If you would like to see a video about joint replacement:   [email or web address]

## 2023-12-21 ENCOUNTER — Encounter (HOSPITAL_COMMUNITY)
Admission: RE | Admit: 2023-12-21 | Discharge: 2023-12-21 | Disposition: A | Discharge status: Home / Self Care | Source: Ambulatory Visit | Attending: Orthopaedic Surgery | Admitting: Orthopaedic Surgery

## 2023-12-21 ENCOUNTER — Other Ambulatory Visit: Payer: Self-pay

## 2023-12-21 ENCOUNTER — Encounter (HOSPITAL_COMMUNITY): Payer: Self-pay

## 2023-12-21 VITALS — BP 146/86 | HR 62 | Temp 98.5°F | Resp 14 | Ht 65.0 in | Wt 145.0 lb

## 2023-12-21 DIAGNOSIS — M1611 Unilateral primary osteoarthritis, right hip: Secondary | ICD-10-CM

## 2023-12-21 DIAGNOSIS — Z01818 Encounter for other preprocedural examination: Secondary | ICD-10-CM | POA: Insufficient documentation

## 2023-12-21 DIAGNOSIS — I10 Essential (primary) hypertension: Secondary | ICD-10-CM | POA: Insufficient documentation

## 2023-12-21 HISTORY — DX: Essential (primary) hypertension: I10

## 2023-12-21 HISTORY — DX: Anxiety disorder, unspecified: F41.9

## 2023-12-21 HISTORY — DX: Unspecified osteoarthritis, unspecified site: M19.90

## 2023-12-21 HISTORY — DX: Hyperlipidemia, unspecified: E78.5

## 2023-12-21 LAB — TYPE AND SCREEN
ABO/RH(D): A POS
Antibody Screen: NEGATIVE

## 2023-12-21 LAB — BASIC METABOLIC PANEL
Anion gap: 10 (ref 5–15)
BUN: 9 mg/dL (ref 8–23)
CO2: 26 mmol/L (ref 22–32)
Calcium: 9.3 mg/dL (ref 8.9–10.3)
Chloride: 102 mmol/L (ref 98–111)
Creatinine, Ser: 0.67 mg/dL (ref 0.44–1.00)
GFR, Estimated: >60 mL/min (ref >60)
Glucose, Bld: 65 mg/dL — ABNORMAL LOW (ref 70–99)
Potassium: 3.2 mmol/L — ABNORMAL LOW (ref 3.5–5.1)
Sodium: 138 mmol/L (ref 135–145)

## 2023-12-21 LAB — CBC
HCT: 40.4 % (ref 36.0–46.0)
Hemoglobin: 13.3 g/dL (ref 12.0–15.0)
MCH: 31.7 pg (ref 26.0–34.0)
MCHC: 32.9 g/dL (ref 30.0–36.0)
MCV: 96.4 fL (ref 80.0–100.0)
Platelets: 253 10*3/uL (ref 150–400)
RBC: 4.19 MIL/uL (ref 3.87–5.11)
RDW: 12.1 % (ref 11.5–15.5)
WBC: 4.4 10*3/uL (ref 4.0–10.5)
nRBC: 0 % (ref 0.0–0.2)

## 2023-12-21 LAB — SURGICAL PCR SCREEN
MRSA, PCR: NEGATIVE
Staphylococcus aureus: POSITIVE — AB

## 2023-12-21 NOTE — Progress Notes (Signed)
 Patient's PCR screen is positive for STAPH. Appropriate notes have been placed on the patient's chart. This note has been routed to Dr.  Jerl Santos and Sharyn Blitz for review. The Patient's surgery is currently scheduled for: 01-02-24 at [facility].  Rudean Haskell, BSN, CVRN-BC   Pre-Surgical Testing Nurse [facility]  [phone]

## 2023-12-25 DIAGNOSIS — M1611 Unilateral primary osteoarthritis, right hip: Secondary | ICD-10-CM | POA: Diagnosis not present

## 2023-12-26 ENCOUNTER — Ambulatory Visit: Payer: Medicare PPO | Admitting: Psychology

## 2023-12-26 DIAGNOSIS — F4323 Adjustment disorder with mixed anxiety and depressed mood: Secondary | ICD-10-CM

## 2023-12-26 NOTE — Progress Notes (Signed)
 [facility] Counselor/Therapist Progress Note  Patient ID: Elainna Eshleman, MRN: [id number],    Date: 12/26/2023  Time Spent: 3:00pm-3:55pm   55 minutes   Treatment Type: Individual Therapy  Reported Symptoms: anxiety, worrying  Mental Status Exam: Appearance:  Casual     Behavior: Appropriate  Motor: Normal  Speech/Language:  Normal Rate  Affect: Appropriate  Mood: normal  Thought process: normal  Thought content:   WNL  Sensory/Perceptual disturbances:   WNL  Orientation: oriented to person, place, time/date, and situation  Attention: Good  Concentration: Good  Memory: WNL  Fund of knowledge:  Good  Insight:   Good  Judgment:  Good  Impulse Control: Good   Risk Assessment: Danger to Self:  No Self-injurious Behavior: No Danger to Others: No Duty to Warn:no Physical Aggression / Violence:No  Access to Firearms a concern: No  Gang Involvement:No   Subjective: Pt present for face-to-face individual therapy via video.  Pt consents to telehealth video session and is aware of limitations and benefits of virtual sessions.   Location of pt: home Location of therapist: home office.   Pt talked about her upcoming hip replacement surgery that is scheduled for April 1st.  She is feeling less anxious about her surgery bc she has met with medical providers and understands the process better.   Pt is getting more use to the idea to "take the leap" to have surgery.  She is also normalizing that having some anxiety is to be expected.   Pt has been thinking about getting older and feeling embarrassed by it.  Pt's pain has kept her from being able to do the things she wants to do and it is hard for pt to have limitations.  Pt's vision of "aging gracefully" is of being more active.   She hopes the hip surgery will improve her mobility.   Pt is trying to be honest about being vulnerable.   Addressed how vulnerability promotes connectivity with people.   Pt can tend to judge  herself and be hard on herself.  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   Addressed that it is ok to have needs.   Pt talked about feeling anxious about the political situation and state of the country.   Worked on thought reframing.   Worked on calming strategies. Pt states she is working on positivity and forward movement.   Pt and niece are planning a trip to [place] for May 9th-15th.   Pt is looking forward to this trip and the hopes that the hip surgery will increase her mobility and subsequent enjoyment.   Worked on self care strategies.   Provided supportive therapy.     Interventions: Cognitive Behavioral Therapy and Insight-Oriented  Diagnosis:  F43.23  Plan of Care: Recommend ongoing therapy.  Pt participated in setting treatment goals.  Pt wants to talk through life stressors.  She wants to figure out what her next project will be.  Pt wants help managing anxiety about politics.   Pt [place]  Pt gets caught up in "what if" thinking about fears and wants to work on thought reframing.   Plan to meet every two weeks.  Pt agrees with treatment plan.    Treatment Plan Client Abilities/Strengths  Pt is bright, engaging, and motivated for therapy.  Client Treatment Preferences  Individual therapy.  Client Statement of Needs  Improve copings skills.  Addressed life stressors. Symptoms  Depressed or irritable mood. Excessive and/or  unrealistic worry that is difficult to control occurring more days than not for at least 6 months about a number of events or activities. Hypervigilance (e.g., feeling constantly on edge, experiencing concentration difficulties, having trouble falling or staying asleep, exhibiting a general state of irritability). Low self-esteem. Problems Addressed  Unipolar Depression, Anxiety Goals 1. Alleviate depressive symptoms and return to previous level of effective functioning. 2. Appropriately grieve the loss in order to  normalize mood and to return to previously adaptive level of functioning. Objective Learn and implement behavioral strategies to overcome depression. Target Date: 2024-10-23 Frequency: Biweekly  Progress: 10 Modality: individual  Related Interventions Assist the client in developing skills that increase the likelihood of deriving pleasure from behavioral activation (e.g., assertiveness skills, developing an exercise plan, less internal/more external focus, increased social involvement); reinforce success. Engage the client in "behavioral activation," increasing his/her activity level and contact with sources of reward, while identifying processes that inhibit activation. use behavioral techniques such as instruction, rehearsal, role-playing, role reversal, as needed, to facilitate activity in the client's daily life; reinforce success. 3. Develop healthy interpersonal relationships that lead to the alleviation and help prevent the relapse of depression. 4. Develop healthy thinking patterns and beliefs about self, others, and the world that lead to the alleviation and help prevent the relapse of depression. 5. Enhance ability to effectively cope with the full variety of life's worries and anxieties. 6. Learn and implement coping skills that result in a reduction of anxiety and worry, and improved daily functioning. Objective Learn and implement problem-solving strategies for realistically addressing worries. Target Date: 2024-10-23 Frequency: Biweekly  Progress: 10 Modality: individual  Related Interventions Assign the client a homework exercise in which he/she problem-solves a current problem (see Mastery of Your Anxiety and Worry: Workbook by Elenora Fender and Filbert Schilder or Generalized Anxiety Disorder by Elesa Hacker, and Filbert Schilder); review, reinforce success, and provide corrective feedback toward improvement. Teach the client problem-solving strategies involving specifically defining a problem, generating  options for addressing it, evaluating the pros and cons of each option, selecting and implementing an optional action, and reevaluating and refining the action. Objective Learn and implement calming skills to reduce overall anxiety and manage anxiety symptoms. Target Date: 2024-10-23 Frequency: Biweekly  Progress: 10 Modality: individual  Related Interventions Assign the client to read about progressive muscle relaxation and other calming strategies in relevant books or treatment manuals (e.g., Progressive Relaxation Training by Twana First; Mastery of Your Anxiety and Worry: Workbook by Earlie Counts). Assign the client homework each session in which he/she practices relaxation exercises daily, gradually applying them progressively from non-anxiety-provoking to anxiety-provoking situations; review and reinforce success while providing corrective feedback toward improvement. Teach the client calming/relaxation skills (e.g., applied relaxation, progressive muscle relaxation, cue controlled relaxation; mindful breathing; biofeedback) and how to discriminate better between relaxation and tension; teach the client how to apply these skills to his/her daily life. 7. Recognize, accept, and cope with feelings of depression. 8. Reduce overall frequency, intensity, and duration of the anxiety so that daily functioning is not impaired. 9. Resolve the core conflict that is the source of anxiety. 10. Stabilize anxiety level while increasing ability to function on a daily basis. Diagnosis F43.23  Salomon Fick, LCSW

## 2023-12-27 NOTE — Care Plan (Signed)
 Ortho Bundle Case Management Note  Patient Details  Name: Elizabeth Murillo MRN: [id number] Date of Birth: [birth year]   spoke with patient, she would like to stay overnight. will have friends staying with her. rolling walker ordered. OPPT set up with [facility] discharge instructions discussed and questions answered. Patient and MD in agreement with plan                   DME Arranged:  Walker rolling DME Agency:  [employer]  HH Arranged:    HH Agency:     Additional Comments: Please contact me with any questions of if this plan should need to change.  Shauna Hugh,  RN,BSN,MHA,CCM  [facility] Orthopaedic Specialist  [phone] 12/27/2023, 11:27 AM

## 2024-01-01 NOTE — H&P (Signed)
 TOTAL HIP ADMISSION H&P  Patient is admitted for right total hip arthroplasty.  Subjective:  Chief Complaint: right hip pain  HPI: Elizabeth Murillo, 73 y.o. female, has a history of pain and functional disability in the right hip(s) due to arthritis and patient has failed non-surgical conservative treatments for greater than 12 weeks to include NSAID's and/or analgesics, corticosteriod injections, flexibility and strengthening excercises, supervised PT with diminished ADL's post treatment, use of assistive devices, weight reduction as appropriate, and activity modification.  Onset of symptoms was gradual starting 5 years ago with gradually worsening course since that time.The patient noted no past surgery on the right hip(s).  Patient currently rates pain in the right hip at 10 out of 10 with activity. Patient has night pain, worsening of pain with activity and weight bearing, trendelenberg gait, pain that interfers with activities of daily living, and crepitus. Patient has evidence of subchondral cysts, subchondral sclerosis, periarticular osteophytes, and joint space narrowing by imaging studies. This condition presents safety issues increasing the risk of falls. There is no current active infection.  Patient Active Problem List   Diagnosis Date Noted   Arthritis of right hip 11/14/2023   Past Medical History:  Diagnosis Date   Anxiety    Arthritis    HLD (hyperlipidemia)    Hypertension     Past Surgical History:  Procedure Laterality Date   COLONOSCOPY W/ POLYPECTOMY     x 2   NO PAST SURGERIES      No current facility-administered medications for this encounter.   Current Outpatient Medications  Medication Sig Dispense Refill Last Dose/Taking   ascorbic acid (VITAMIN C) 500 MG tablet Take 500 mg by mouth daily.   Taking   b complex vitamins capsule Take 1 capsule by mouth daily.   Taking   Chelated Magnesium 100 MG TABS Take 200 mg by mouth daily.   Taking   ibuprofen (ADVIL) 200  MG tablet Take 400 mg by mouth daily.   Taking   Multiple Vitamins-Minerals (MULTIVITAMIN WITH MINERALS) tablet Take 1 tablet by mouth daily.   Taking   Turmeric 500 MG TABS Take 500 mg by mouth daily.   Taking   rosuvastatin (CRESTOR) 10 MG tablet Take 10 mg by mouth daily.      No Known Allergies  Social History   Tobacco Use   Smoking status: Former    Types: Cigarettes    Start date: 1980    Quit date: 1970    Years since quitting: 55.2   Smokeless tobacco: Never  Substance Use Topics   Alcohol use: Yes    Comment: occasional    No family history on file.   Review of Systems  Musculoskeletal:  Positive for arthralgias.       Right hip  All other systems reviewed and are negative.   Objective:  Physical Exam Constitutional:      Appearance: Normal appearance.  HENT:     Head: Normocephalic and atraumatic.     Nose: Nose normal.     Mouth/Throat:     Pharynx: Oropharynx is clear.  Eyes:     Extraocular Movements: Extraocular movements intact.  Pulmonary:     Effort: Pulmonary effort is normal.  Abdominal:     Palpations: Abdomen is soft.  Musculoskeletal:     Cervical back: Normal range of motion.     Comments: Right hip motion is extremely limited and somewhat painful with rotation.  Straight leg raise is negative.  Sensation and motor function  are intact.  She walks with an altered gait.    Skin:    General: Skin is warm and dry.  Neurological:     General: No focal deficit present.     Mental Status: She is alert and oriented to person, place, and time.  Psychiatric:        Mood and Affect: Mood normal.        Behavior: Behavior normal.        Thought Content: Thought content normal.        Judgment: Judgment normal.     Vital signs in last 24 hours:    Labs:   Estimated body mass index is 24.13 kg/m as calculated from the following:   Height as of 12/21/23: 5\' 5"  (1.651 m).   Weight as of 12/21/23: 65.8 kg.   Imaging Review Plain radiographs  demonstrate severe degenerative joint disease of the right hip(s). The bone quality appears to be good for age and reported activity level.      Assessment/Plan:  End stage primary arthritis, right hip(s)  The patient history, physical examination, clinical judgement of the provider and imaging studies are consistent with end stage degenerative joint disease of the right hip(s) and total hip arthroplasty is deemed medically necessary. The treatment options including medical management, injection therapy, arthroscopy and arthroplasty were discussed at length. The risks and benefits of total hip arthroplasty were presented and reviewed. The risks due to aseptic loosening, infection, stiffness, dislocation/subluxation,  thromboembolic complications and other imponderables were discussed.  The patient acknowledged the explanation, agreed to proceed with the plan and consent was signed. Patient is being admitted for inpatient treatment for surgery, pain control, PT, OT, prophylactic antibiotics, VTE prophylaxis, progressive ambulation and ADL's and discharge planning.The patient is planning to be discharged home with home health services

## 2024-01-02 ENCOUNTER — Ambulatory Visit (HOSPITAL_COMMUNITY): Admitting: Anesthesiology

## 2024-01-02 ENCOUNTER — Observation Stay (HOSPITAL_COMMUNITY)
Admission: RE | Admit: 2024-01-02 | Discharge: 2024-01-03 | Disposition: A | Discharge status: Home / Self Care | Attending: Orthopaedic Surgery | Admitting: Orthopaedic Surgery

## 2024-01-02 ENCOUNTER — Other Ambulatory Visit: Payer: Self-pay

## 2024-01-02 ENCOUNTER — Ambulatory Visit (HOSPITAL_COMMUNITY)

## 2024-01-02 ENCOUNTER — Encounter (HOSPITAL_COMMUNITY)
Admission: RE | Disposition: A | Discharge status: Home / Self Care | Payer: Self-pay | Source: Home / Self Care | Attending: Orthopaedic Surgery

## 2024-01-02 ENCOUNTER — Encounter (HOSPITAL_COMMUNITY): Payer: Self-pay | Admitting: Orthopaedic Surgery

## 2024-01-02 DIAGNOSIS — M1611 Unilateral primary osteoarthritis, right hip: Secondary | ICD-10-CM | POA: Diagnosis not present

## 2024-01-02 DIAGNOSIS — Z87891 Personal history of nicotine dependence: Secondary | ICD-10-CM | POA: Diagnosis not present

## 2024-01-02 DIAGNOSIS — I10 Essential (primary) hypertension: Secondary | ICD-10-CM | POA: Insufficient documentation

## 2024-01-02 DIAGNOSIS — Z79899 Other long term (current) drug therapy: Secondary | ICD-10-CM | POA: Insufficient documentation

## 2024-01-02 DIAGNOSIS — Z96641 Presence of right artificial hip joint: Secondary | ICD-10-CM | POA: Diagnosis not present

## 2024-01-02 HISTORY — PX: TOTAL HIP ARTHROPLASTY: SHX124

## 2024-01-02 LAB — ABO/RH: ABO/RH(D): A POS

## 2024-01-02 SURGERY — ARTHROPLASTY, HIP, TOTAL, ANTERIOR APPROACH
Anesthesia: Monitor Anesthesia Care | Site: Hip | Laterality: Right

## 2024-01-02 MED ORDER — ACETAMINOPHEN 325 MG PO TABS: 325.0000 mg | ORAL_TABLET | Freq: Four times a day (QID) | ORAL | Status: DC | PRN

## 2024-01-02 MED ORDER — PHENOL 1.4 % MT LIQD: 1.0000 | OROMUCOSAL | Status: DC | PRN

## 2024-01-02 MED ORDER — OXYCODONE HCL 5 MG/5ML PO SOLN: 5.0000 mg | Freq: Once | ORAL | Status: DC | PRN

## 2024-01-02 MED ORDER — ONDANSETRON HCL 4 MG/2ML IJ SOLN
INTRAMUSCULAR | Status: DC | PRN
  Administered 2024-01-02: 4 mg via INTRAVENOUS

## 2024-01-02 MED ORDER — DIPHENHYDRAMINE HCL 12.5 MG/5ML PO ELIX
12.5000 mg | ORAL_SOLUTION | ORAL | Status: DC | PRN
  Administered 2024-01-02: 25 mg via ORAL
  Filled 2024-01-02: qty 10

## 2024-01-02 MED ORDER — TRANEXAMIC ACID 1000 MG/10ML IV SOLN
INTRAVENOUS | Status: DC | PRN
  Administered 2024-01-02: 2000 mg via TOPICAL

## 2024-01-02 MED ORDER — CHLORHEXIDINE GLUCONATE 0.12 % MT SOLN
15.0000 mL | Freq: Once | OROMUCOSAL | Status: AC
  Administered 2024-01-02: 15 mL via OROMUCOSAL

## 2024-01-02 MED ORDER — ONDANSETRON HCL 4 MG/2ML IJ SOLN
4.0000 mg | Freq: Four times a day (QID) | INTRAMUSCULAR | Status: DC | PRN
Start: 2024-01-02 — End: 2024-01-03

## 2024-01-02 MED ORDER — BUPIVACAINE LIPOSOME 1.3 % IJ SUSP
INTRAMUSCULAR | Status: AC
  Filled 2024-01-02: qty 10

## 2024-01-02 MED ORDER — BUPIVACAINE LIPOSOME 1.3 % IJ SUSP: 10.0000 mL | Freq: Once | INTRAMUSCULAR | Status: DC

## 2024-01-02 MED ORDER — LACTATED RINGERS IV SOLN: INTRAVENOUS | Status: DC

## 2024-01-02 MED ORDER — ALUM & MAG HYDROXIDE-SIMETH 200-200-20 MG/5ML PO SUSP: 30.0000 mL | ORAL | Status: DC | PRN

## 2024-01-02 MED ORDER — OXYCODONE HCL 5 MG PO TABS: 5.0000 mg | ORAL_TABLET | Freq: Once | ORAL | Status: DC | PRN

## 2024-01-02 MED ORDER — METHOCARBAMOL 1000 MG/10ML IJ SOLN: 500.0000 mg | Freq: Four times a day (QID) | INTRAMUSCULAR | Status: DC | PRN

## 2024-01-02 MED ORDER — ACETAMINOPHEN 500 MG PO TABS
1000.0000 mg | ORAL_TABLET | Freq: Once | ORAL | Status: AC
  Administered 2024-01-02: 1000 mg via ORAL
  Filled 2024-01-02: qty 2

## 2024-01-02 MED ORDER — MORPHINE SULFATE (PF) 2 MG/ML IV SOLN: 0.5000 mg | INTRAVENOUS | Status: DC | PRN

## 2024-01-02 MED ORDER — PROPOFOL 500 MG/50ML IV EMUL
INTRAVENOUS | Status: DC | PRN
  Administered 2024-01-02: 150 ug/kg/min via INTRAVENOUS

## 2024-01-02 MED ORDER — MIDAZOLAM HCL 2 MG/2ML IJ SOLN: 0.5000 mg | Freq: Once | INTRAMUSCULAR | Status: DC | PRN

## 2024-01-02 MED ORDER — METOCLOPRAMIDE HCL 5 MG PO TABS: 5.0000 mg | ORAL_TABLET | Freq: Three times a day (TID) | ORAL | Status: DC | PRN

## 2024-01-02 MED ORDER — METHOCARBAMOL 500 MG PO TABS
500.0000 mg | ORAL_TABLET | Freq: Four times a day (QID) | ORAL | Status: DC | PRN
  Administered 2024-01-02 – 2024-01-03 (×2): 500 mg via ORAL
  Filled 2024-01-02 (×2): qty 1

## 2024-01-02 MED ORDER — DEXAMETHASONE SODIUM PHOSPHATE 10 MG/ML IJ SOLN
INTRAMUSCULAR | Status: DC | PRN
  Administered 2024-01-02: 10 mg via INTRAVENOUS

## 2024-01-02 MED ORDER — POVIDONE-IODINE 10 % EX SWAB
2.0000 | Freq: Once | CUTANEOUS | Status: AC
  Administered 2024-01-02: 2 via TOPICAL

## 2024-01-02 MED ORDER — PROPOFOL 10 MG/ML IV BOLUS
INTRAVENOUS | Status: DC | PRN
  Administered 2024-01-02: 20 mg via INTRAVENOUS

## 2024-01-02 MED ORDER — KETOROLAC TROMETHAMINE 15 MG/ML IJ SOLN
7.5000 mg | Freq: Four times a day (QID) | INTRAMUSCULAR | Status: AC
  Administered 2024-01-02 – 2024-01-03 (×4): 7.5 mg via INTRAVENOUS
  Filled 2024-01-02 (×4): qty 1

## 2024-01-02 MED ORDER — MIDAZOLAM HCL 2 MG/2ML IJ SOLN
INTRAMUSCULAR | Status: AC
  Filled 2024-01-02: qty 2

## 2024-01-02 MED ORDER — ACETAMINOPHEN 500 MG PO TABS
500.0000 mg | ORAL_TABLET | Freq: Four times a day (QID) | ORAL | Status: AC
  Administered 2024-01-02 – 2024-01-03 (×4): 500 mg via ORAL
  Filled 2024-01-02 (×4): qty 1

## 2024-01-02 MED ORDER — HYDROCODONE-ACETAMINOPHEN 5-325 MG PO TABS
1.0000 | ORAL_TABLET | ORAL | Status: DC | PRN
  Administered 2024-01-03: 1 via ORAL
  Filled 2024-01-02: qty 1

## 2024-01-02 MED ORDER — ONDANSETRON HCL 4 MG PO TABS: 4.0000 mg | ORAL_TABLET | Freq: Four times a day (QID) | ORAL | Status: DC | PRN

## 2024-01-02 MED ORDER — CHLORHEXIDINE GLUCONATE 4 % EX SOLN: 1.0000 | CUTANEOUS | 1 refills | Status: AC

## 2024-01-02 MED ORDER — TRANEXAMIC ACID-NACL 1000-0.7 MG/100ML-% IV SOLN
1000.0000 mg | INTRAVENOUS | Status: AC
  Administered 2024-01-02: 1000 mg via INTRAVENOUS
  Filled 2024-01-02: qty 100

## 2024-01-02 MED ORDER — 0.9 % SODIUM CHLORIDE (POUR BTL) OPTIME
TOPICAL | Status: DC | PRN
  Administered 2024-01-02: 1000 mL

## 2024-01-02 MED ORDER — MIDAZOLAM HCL 2 MG/2ML IJ SOLN
INTRAMUSCULAR | Status: DC | PRN
  Administered 2024-01-02 (×2): 1 mg via INTRAVENOUS

## 2024-01-02 MED ORDER — BUPIVACAINE-EPINEPHRINE (PF) 0.25% -1:200000 IJ SOLN
INTRAMUSCULAR | Status: DC | PRN
  Administered 2024-01-02: 40 mL

## 2024-01-02 MED ORDER — BUPIVACAINE-EPINEPHRINE (PF) 0.25% -1:200000 IJ SOLN
INTRAMUSCULAR | Status: AC
  Filled 2024-01-02: qty 30

## 2024-01-02 MED ORDER — ASPIRIN 81 MG PO TBEC: 81.0000 mg | DELAYED_RELEASE_TABLET | Freq: Two times a day (BID) | ORAL | 0 refills | Status: AC

## 2024-01-02 MED ORDER — MEPERIDINE HCL 50 MG/ML IJ SOLN: 6.2500 mg | INTRAMUSCULAR | Status: DC | PRN

## 2024-01-02 MED ORDER — HYDROMORPHONE HCL 1 MG/ML IJ SOLN: 0.2500 mg | INTRAMUSCULAR | Status: DC | PRN

## 2024-01-02 MED ORDER — MENTHOL 3 MG MT LOZG: 1.0000 | LOZENGE | OROMUCOSAL | Status: DC | PRN

## 2024-01-02 MED ORDER — DOCUSATE SODIUM 100 MG PO CAPS
100.0000 mg | ORAL_CAPSULE | Freq: Two times a day (BID) | ORAL | Status: DC
  Administered 2024-01-02 – 2024-01-03 (×3): 100 mg via ORAL
  Filled 2024-01-02 (×3): qty 1

## 2024-01-02 MED ORDER — BUPIVACAINE IN DEXTROSE 0.75-8.25 % IT SOLN
INTRATHECAL | Status: DC | PRN
  Administered 2024-01-02: 12 mg via INTRATHECAL

## 2024-01-02 MED ORDER — ORAL CARE MOUTH RINSE: 15.0000 mL | Freq: Once | OROMUCOSAL | Status: AC

## 2024-01-02 MED ORDER — MUPIROCIN 2 % EX OINT
1.0000 | TOPICAL_OINTMENT | Freq: Two times a day (BID) | CUTANEOUS | 0 refills | Status: AC
Start: 2024-01-02 — End: 2024-02-01

## 2024-01-02 MED ORDER — FENTANYL CITRATE (PF) 100 MCG/2ML IJ SOLN
INTRAMUSCULAR | Status: AC
  Filled 2024-01-02: qty 2

## 2024-01-02 MED ORDER — HYDROCODONE-ACETAMINOPHEN 7.5-325 MG PO TABS
1.0000 | ORAL_TABLET | ORAL | Status: DC | PRN
  Administered 2024-01-02 (×2): 2 via ORAL
  Filled 2024-01-02 (×3): qty 2

## 2024-01-02 MED ORDER — ROSUVASTATIN CALCIUM 10 MG PO TABS
10.0000 mg | ORAL_TABLET | Freq: Every day | ORAL | Status: DC
  Administered 2024-01-02 – 2024-01-03 (×2): 10 mg via ORAL
  Filled 2024-01-02 (×2): qty 1

## 2024-01-02 MED ORDER — CEFAZOLIN SODIUM-DEXTROSE 2-4 GM/100ML-% IV SOLN
2.0000 g | INTRAVENOUS | Status: AC
  Administered 2024-01-02: 2 g via INTRAVENOUS
  Filled 2024-01-02: qty 100

## 2024-01-02 MED ORDER — HYDROCODONE-ACETAMINOPHEN 5-325 MG PO TABS: 1.0000 | ORAL_TABLET | Freq: Four times a day (QID) | ORAL | 0 refills | Status: AC | PRN

## 2024-01-02 MED ORDER — PHENYLEPHRINE HCL-NACL 20-0.9 MG/250ML-% IV SOLN
INTRAVENOUS | Status: DC | PRN
  Administered 2024-01-02: 25 ug/min via INTRAVENOUS

## 2024-01-02 MED ORDER — TRANEXAMIC ACID 1000 MG/10ML IV SOLN
2000.0000 mg | INTRAVENOUS | Status: DC
  Filled 2024-01-02 (×2): qty 20

## 2024-01-02 MED ORDER — CEFAZOLIN SODIUM-DEXTROSE 2-4 GM/100ML-% IV SOLN
2.0000 g | Freq: Four times a day (QID) | INTRAVENOUS | Status: AC
  Administered 2024-01-02 (×2): 2 g via INTRAVENOUS
  Filled 2024-01-02 (×2): qty 100

## 2024-01-02 MED ORDER — FENTANYL CITRATE (PF) 100 MCG/2ML IJ SOLN
INTRAMUSCULAR | Status: DC | PRN
  Administered 2024-01-02: 50 ug via INTRAVENOUS

## 2024-01-02 MED ORDER — ASPIRIN 81 MG PO CHEW
81.0000 mg | CHEWABLE_TABLET | Freq: Two times a day (BID) | ORAL | Status: DC
  Administered 2024-01-03: 81 mg via ORAL
  Filled 2024-01-02: qty 1

## 2024-01-02 MED ORDER — BISACODYL 5 MG PO TBEC: 5.0000 mg | DELAYED_RELEASE_TABLET | Freq: Every day | ORAL | Status: DC | PRN

## 2024-01-02 MED ORDER — TIZANIDINE HCL 4 MG PO TABS: 4.0000 mg | ORAL_TABLET | Freq: Four times a day (QID) | ORAL | 1 refills | Status: AC | PRN

## 2024-01-02 MED ORDER — ONDANSETRON HCL 4 MG/2ML IJ SOLN
INTRAMUSCULAR | Status: AC
  Filled 2024-01-02: qty 2

## 2024-01-02 MED ORDER — METOCLOPRAMIDE HCL 5 MG/ML IJ SOLN: 5.0000 mg | Freq: Three times a day (TID) | INTRAMUSCULAR | Status: DC | PRN

## 2024-01-02 SURGICAL SUPPLY — 48 items
BAG COUNTER SPONGE SURGICOUNT (BAG) IMPLANT
BAG DECANTER FOR FLEXI CONT (MISCELLANEOUS) ×1 IMPLANT
BLADE SAW SGTL 18X1.27X75 (BLADE) ×1 IMPLANT
BOOTIES KNEE HIGH SLOAN (MISCELLANEOUS) ×2 IMPLANT
COVER PERINEAL POST (MISCELLANEOUS) ×2 IMPLANT
COVER SURGICAL LIGHT HANDLE (MISCELLANEOUS) ×2 IMPLANT
CUP ACETABULAR GRIPTON 100 52 (Orthopedic Implant) IMPLANT
DRAPE FOOT SWITCH (DRAPES) ×1 IMPLANT
DRAPE IMP U-DRAPE 54X76 (DRAPES) ×1 IMPLANT
DRAPE STERI IOBAN 125X83 (DRAPES) ×2 IMPLANT
DRAPE U-SHAPE 47X51 STRL (DRAPES) ×2 IMPLANT
DRSG AQUACEL AG ADV 3.5X 6 (GAUZE/BANDAGES/DRESSINGS) ×1 IMPLANT
DURAPREP 26ML APPLICATOR (WOUND CARE) ×2 IMPLANT
ELECT BLADE TIP CTD 4 INCH (ELECTRODE) ×1 IMPLANT
ELECT REM PT RETURN 15FT ADLT (MISCELLANEOUS) ×2 IMPLANT
ELIMINATOR HOLE APEX DEPUY (Hips) IMPLANT
GLOVE BIO SURGEON STRL SZ8 (GLOVE) ×4 IMPLANT
GLOVE BIOGEL PI IND STRL 7.0 (GLOVE) ×2 IMPLANT
GLOVE BIOGEL PI IND STRL 8 (GLOVE) ×4 IMPLANT
GLOVE SURG SYN 7.0 (GLOVE) ×1 IMPLANT
GLOVE SURG SYN 7.0 PF PI (GLOVE) ×1 IMPLANT
GOWN SRG XL LVL 4 BRTHBL STRL (GOWNS) ×1 IMPLANT
GOWN STRL REUS W/ TWL XL LVL3 (GOWN DISPOSABLE) ×2 IMPLANT
GRIPTON 100 52 (Orthopedic Implant) ×1 IMPLANT
HEAD CERAMIC DELTA 36 PLUS 1.5 (Hips) IMPLANT
HOLDER FOLEY CATH W/STRAP (MISCELLANEOUS) ×2 IMPLANT
KIT TURNOVER KIT A (KITS) IMPLANT
LINER NEUTRAL 52X36MM PLUS 4 (Liner) IMPLANT
MANIFOLD NEPTUNE II (INSTRUMENTS) ×2 IMPLANT
NDL HYPO 22X1.5 SAFETY MO (MISCELLANEOUS) ×1 IMPLANT
NEEDLE HYPO 22X1.5 SAFETY MO (MISCELLANEOUS) ×1 IMPLANT
NS IRRIG 1000ML POUR BTL (IV SOLUTION) ×1 IMPLANT
PACK ANTERIOR HIP CUSTOM (KITS) ×1 IMPLANT
PROTECTOR NERVE ULNAR (MISCELLANEOUS) ×2 IMPLANT
RETRACTOR WND ALEXIS 18 MED (MISCELLANEOUS) ×1 IMPLANT
RTRCTR WOUND ALEXIS 18CM MED (MISCELLANEOUS) ×1 IMPLANT
SPIKE FLUID TRANSFER (MISCELLANEOUS) ×2 IMPLANT
STEM FEM ACTIS STD SZ7 (Nail) IMPLANT
STRIP CLOSURE SKIN 1/2X4 (GAUZE/BANDAGES/DRESSINGS) IMPLANT
SUT ETHIBOND NAB CT1 #1 30IN (SUTURE) ×2 IMPLANT
SUT STRATAFIX 0 PDS 27 VIOLET (SUTURE) ×1 IMPLANT
SUT VIC AB 1 CT1 36 (SUTURE) ×2 IMPLANT
SUT VIC AB 2-0 CT1 TAPERPNT 27 (SUTURE) ×1 IMPLANT
SUT VICRYL AB 3-0 FS1 BRD 27IN (SUTURE) ×2 IMPLANT
SUTURE STRATFX 0 PDS 27 VIOLET (SUTURE) ×2 IMPLANT
SYR 50ML LL SCALE MARK (SYRINGE) ×1 IMPLANT
TRAY FOLEY MTR SLVR 16FR STAT (SET/KITS/TRAYS/PACK) ×1 IMPLANT
YANKAUER SUCT BULB TIP NO VENT (SUCTIONS) ×2 IMPLANT

## 2024-01-02 NOTE — Op Note (Signed)
 PRE-OP DIAGNOSIS:  RIGHT HIP DEGENERATIVE JOINT DISEASE POST-OP DIAGNOSIS:  same PROCEDURE: RIGHT TOTAL HIP ARTHROPLASTY ANTERIOR APPROACH ANESTHESIA:  Spinal and MAC SURGEON:  Marcene Corning MD ASSISTANT:  Elodia Florence PA-C   INDICATIONS FOR PROCEDURE:  The patient is a 73 y.o. female with a long history of a painful hip.  This has persisted despite multiple conservative measures.  The patient has persisted with pain and dysfunction making rest and activity difficult.  A total hip replacement is offered as surgical treatment.  Informed operative consent was obtained after discussion of possible complications including reaction to anesthesia, infection, neurovascular injury, dislocation, DVT, PE, and death.  The importance of the postoperative rehab program to optimize result was stressed with the patient.  SUMMARY OF FINDINGS AND PROCEDURE:  Under the above anesthesia through a anterior approach an the Hana table a right THR was performed.  The patient had severe degenerative change and fair bone quality.  We used DePuy components to replace the hip and these were size 7 Actis femur capped with a +1.5 36mm ceramic hip ball.  On the acetabular side we used a size 52 Gription shell with a  plus 4 neutral polyethylene liner.  We did use a hole eliminator.  Elodia Florence PA-C assisted throughout and was invaluable to the completion of the case in that he helped position and retract while I performed the procedure.  He also closed simultaneously to help minimize OR time.  I used fluoroscopy throughout the case to check position of implants and leg lengths and read all of these views myself.  DESCRIPTION OF PROCEDURE:  The patient was taken to the OR suite where the above anesthetic was applied.  The patient was then positioned on the Hana table supine.  All bony prominences were appropriately padded.  Prep and drape was then performed in normal sterile fashion.  The patient was given kefzol preoperative  antibiotic and an appropriate time out was performed.  We then took an anterior approach to the right hip.  Dissection was taken through adipose to the tensor fascia lata fascia.  This structure was incised longitudinally and we dissected in the intermuscular interval just medial to this muscle.  Cobra retractors were placed superior and inferior to the femoral neck superficial to the capsule.  A capsular incision was then made and the retractors were placed along the femoral neck.  Xray was brought in to get a good level for the femoral neck cut which was made with an oscillating saw and osteotome.  The femoral head was removed with a corkscrew.  The acetabulum was exposed and some labral tissues were excised. Reaming was taken to the inside wall of the pelvis and sequentially up to 1 mm smaller than the actual component.  A trial of components was done and then the aforementioned acetabular shell was placed in appropriate tilt and anteversion confirmed by fluoroscopy. The liner was placed along with the hole eliminator and attention was turned to the femur.  The leg was brought down and over into adduction and the elevator bar was used to raise the femur up gently in the wound.  The piriformis was released with care taken to preserve the obturator internus attachment and all of the posterior capsule. The femur was reamed and then broached to the appropriate size.  A trial reduction was done and the aforementioned head and neck assembly gave [place] the best stability in extension with external rotation.  Leg lengths were felt to be about equal  by fluoroscopic exam.  The trial components were removed and the wound irrigated.  We then placed the femoral component in appropriate anteversion.  The head was applied to a dry stem neck and the hip again reduced.  It was again stable in the aforementioned position.  The would was irrigated again followed by re-approximation of anterior capsule with ethibond suture. Tensor  fascia was repaired with V-loc suture  followed by deep closure with #O and #2 undyed vicryl.  Skin was closed with subQ stitch and steristrips followed by a sterile dressing.  EBL and IOF can be obtained from anesthesia records.  DISPOSITION:  The patient was taken to PACU in stable condition to potentially go home same day depending on ability to walk and tolerate liquids.

## 2024-01-02 NOTE — Anesthesia Procedure Notes (Addendum)
 Spinal  Patient location during procedure: OR Start time: 01/02/2024 10:20 AM End time: 01/02/2024 10:23 AM Reason for block: surgical anesthesia Staffing Performed: resident/CRNA  Resident/CRNA: Dairl Ponder, CRNA Performed by: Dairl Ponder, CRNA Authorized by: Jairo Ben, MD   Preanesthetic Checklist Completed: patient identified, IV checked, site marked, risks and benefits discussed, surgical consent, monitors and equipment checked, pre-op evaluation and timeout performed Spinal Block Patient position: sitting Prep: DuraPrep and site prepped and draped Patient monitoring: heart rate, cardiac monitor, continuous pulse ox and blood pressure Approach: midline Location: L3-4 Injection technique: single-shot Needle Needle type: Pencan  Needle gauge: 24 G Needle length: 9 cm Assessment Sensory level: T4 Events: CSF return Additional Notes IV functioning, monitors applied to pt. Expiration date of kit checked and confirmed to be in date. Sterile prep and drape, hand hygiene and sterile gloved used. Pt was positioned and spine was prepped in sterile fashion. Skin was anesthetized with lidocaine. Free flow of clear CSF obtained prior to injecting local anesthetic into CSF x 1 attempt. Spinal needle aspirated freely following injection. Needle was carefully withdrawn, and pt tolerated procedure well. Loss of motor and sensory on exam post injection.

## 2024-01-02 NOTE — Transfer of Care (Signed)
 Immediate Anesthesia Transfer of Care Note  Patient: Elizabeth Murillo  Procedure(s) Performed: ARTHROPLASTY, HIP, TOTAL, ANTERIOR APPROACH (Right: Hip)  Patient Location: PACU  Anesthesia Type:MAC and Spinal  Level of Consciousness: awake, alert , and oriented  Airway & Oxygen Therapy: Patient Spontanous Breathing  Post-op Assessment: Report given to RN and Post -op Vital signs reviewed and stable  Post vital signs: Reviewed and stable  Last Vitals:  Vitals Value Taken Time  BP 97/61 01/02/24 1200  Temp    Pulse 62 01/02/24 1202  Resp 14 01/02/24 1202  SpO2 97 % 01/02/24 1202  Vitals shown include unfiled device data.  Last Pain:  Vitals:   01/02/24 0938  TempSrc:   PainSc: 3       Patients Stated Pain Goal: 4 (01/02/24 8119)  Complications: No notable events documented.

## 2024-01-02 NOTE — Plan of Care (Signed)
   Problem: Education: Goal: Knowledge of General Education information will improve Description: Including pain rating scale, medication(s)/side effects and non-pharmacologic comfort measures Outcome: Progressing   Problem: Coping: Goal: Level of anxiety will decrease Outcome: Progressing

## 2024-01-02 NOTE — Anesthesia Postprocedure Evaluation (Signed)
 Anesthesia Post Note  Patient: Elizabeth Murillo  Procedure(s) Performed: ARTHROPLASTY, HIP, TOTAL, ANTERIOR APPROACH (Right: Hip)     Patient location during evaluation: PACU Anesthesia Type: MAC and Spinal Level of consciousness: oriented, awake and alert and patient cooperative Pain management: pain level controlled Vital Signs Assessment: post-procedure vital signs reviewed and stable Respiratory status: spontaneous breathing, respiratory function stable and nonlabored ventilation Cardiovascular status: blood pressure returned to baseline and stable Postop Assessment: no headache, no backache, no apparent nausea or vomiting and spinal receding Anesthetic complications: no   No notable events documented.  Last Vitals:  Vitals:   01/02/24 1300 01/02/24 1316  BP: (!) 145/60 (!) 146/63  Pulse: (!) 48 (!) 48  Resp: 12 15  Temp: (!) 36.4 C 36.6 C  SpO2: 99% 99%    Last Pain:  Vitals:   01/02/24 1316  TempSrc: Oral  PainSc:     LLE Motor Response: Purposeful movement (01/02/24 1330) LLE Sensation: Decreased;Numbness (01/02/24 1330) RLE Motor Response: Purposeful movement (01/02/24 1330) RLE Sensation: Decreased;Numbness (01/02/24 1330) L Sensory Level: L4-Anterior knee, lower leg (01/02/24 1330) R Sensory Level: L3-Anterior knee, lower leg (01/02/24 1330)  Blandon Offerdahl,[name]

## 2024-01-02 NOTE — Anesthesia Preprocedure Evaluation (Addendum)
 Anesthesia Evaluation  Patient identified by MRN, date of birth, ID band Patient awake    Reviewed: Allergy & Precautions, NPO status , Patient's Chart, lab work & pertinent test results  History of Anesthesia Complications Negative for: history of anesthetic complications  Airway Mallampati: II  TM Distance: >3 FB Neck ROM: Full    Dental  (+) Dental Advisory Given   Pulmonary neg pulmonary ROS, former smoker   breath sounds clear to auscultation       Cardiovascular (-) angina negative cardio ROS  Rhythm:Regular Rate:Normal     Neuro/Psych   Anxiety     negative neurological ROS     GI/Hepatic negative GI ROS, Neg liver ROS,,,  Endo/Other  negative endocrine ROS    Renal/GU negative Renal ROS     Musculoskeletal   Abdominal   Peds  Hematology negative hematology ROS (+) Hb 13.3, plt 253k   Anesthesia Other Findings   Reproductive/Obstetrics                             Anesthesia Physical Anesthesia Plan  ASA: 2  Anesthesia Plan: Spinal   Post-op Pain Management: Tylenol PO (pre-op)*   Induction:   PONV Risk Score and Plan: 2 and Ondansetron  Airway Management Planned: Natural Airway and Simple Face Mask  Additional Equipment: None  Intra-op Plan:   Post-operative Plan:   Informed Consent: I have reviewed the patients History and Physical, chart, labs and discussed the procedure including the risks, benefits and alternatives for the proposed anesthesia with the patient or authorized representative who has indicated his/her understanding and acceptance.     Dental advisory given  Plan Discussed with: CRNA and Surgeon  Anesthesia Plan Comments:         Anesthesia Quick Evaluation

## 2024-01-02 NOTE — Evaluation (Signed)
 Physical Therapy Evaluation Patient Details Name: Elizabeth Murillo MRN: [id number] DOB: 02-Aug-1951 Today's Date: 01/02/2024  History of Present Illness  73 yo female , S/P R THA, direct anterior approach. PMH: htn  Clinical Impression  Pt admitted with above diagnosis.  Pt currently with functional limitations due to the deficits listed below (see PT Problem List). Pt will benefit from acute skilled PT to increase their independence and safety with mobility to allow discharge.     The patient ambulated x 120' using a RW. Patie tien reports pain is minimal. Reports slight dizziness, Thinks it may due to pain  medication. Patient should progress to DC home with friends assisting.       If plan is discharge home, recommend the following: A little help with bathing/dressing/bathroom;Assistance with cooking/housework;Assist for transportation;Help with stairs or ramp for entrance   Can travel by private vehicle        Equipment Recommendations Rolling walker (2 wheels)  Recommendations for Other Services       Functional Status Assessment Patient has had a recent decline in their functional status and demonstrates the ability to make significant improvements in function in a reasonable and predictable amount of time.     Precautions / Restrictions Precautions Precautions: Fall Restrictions RLE Weight Bearing Per Provider Order: Weight bearing as tolerated      Mobility  Bed Mobility Overal bed mobility: Needs Assistance Bed Mobility: Supine to Sit     Supine to sit: Supervision, HOB elevated          Transfers   Equipment used: Rolling walker (2 wheels)               General transfer comment: cues for hand placement    Ambulation/Gait Ambulation/Gait assistance: Contact guard assist Gait Distance (Feet): 120 Feet Assistive device: Rolling walker (2 wheels) Gait Pattern/deviations: Step-to pattern, Step-through pattern Gait velocity: decr     General Gait  Details: cues for sequence  Stairs            Wheelchair Mobility     Tilt Bed    Modified Rankin (Stroke Patients Only)       Balance Overall balance assessment: No apparent balance deficits (not formally assessed)                                           Pertinent Vitals/Pain Pain Assessment Pain Assessment: 0-10 Pain Score: 3  Pain Location: right  inner thigh Pain Descriptors / Indicators: Discomfort Pain Intervention(s): Monitored during session, Premedicated before session, Ice applied    Home Living Family/patient expects to be discharged to:: Private residence Living Arrangements: Alone Available Help at Discharge: Friend(s);Available PRN/intermittently Type of Home: House Home Access: Stairs to enter   [employer] of Steps: 1   Home Layout: One level Home Equipment: None      Prior Function Prior Level of Function : Independent/Modified Independent;Driving                     Extremity/Trunk Assessment   Upper Extremity Assessment Upper Extremity Assessment: Overall WFL for tasks assessed    Lower Extremity Assessment Lower Extremity Assessment: RLE deficits/detail RLE Deficits / Details: able to advance the leg    Cervical / Trunk Assessment Cervical / Trunk Assessment: Normal  Communication        Cognition Arousal: Alert Behavior During Therapy: [facility] for tasks  assessed/performed   PT - Cognitive impairments: No apparent impairments                                 Cueing       General Comments      Exercises     Assessment/Plan    PT Assessment Patient needs continued PT services  PT Problem List Decreased strength;Decreased activity tolerance;Decreased mobility;Decreased range of motion;Pain       PT Treatment Interventions DME instruction;Therapeutic activities;Stair training;Gait training;Functional mobility training;Therapeutic exercise;Patient/family education     PT Goals (Current goals can be found in the Care Plan section)  Acute Rehab PT Goals Patient Stated Goal: go home PT Goal Formulation: With patient Time For Goal Achievement: 01/09/24 Potential to Achieve Goals: Good    Frequency 7X/week     Co-evaluation               AM-PAC PT "6 Clicks" Mobility  Outcome Measure Help needed turning from your back to your side while in a flat bed without using bedrails?: None Help needed moving from lying on your back to sitting on the side of a flat bed without using bedrails?: None Help needed moving to and from a bed to a chair (including a wheelchair)?: A Little Help needed standing up from a chair using your arms (e.g., wheelchair or bedside chair)?: A Little Help needed to walk in hospital room?: A Little Help needed climbing 3-5 steps with a railing? : A Little 6 Click Score: 20    End of Session Equipment Utilized During Treatment: Gait belt Activity Tolerance: Patient tolerated treatment well Patient left: in chair;with call bell/phone within reach;with chair alarm set Nurse Communication: Mobility status PT Visit Diagnosis: Unsteadiness on feet (R26.81);Muscle weakness (generalized) (M62.81);Pain Pain - Right/Left: Right Pain - part of body: Leg    Time: 1610-9604 PT Time Calculation (min) (ACUTE ONLY): 29 min   Charges:   PT Evaluation $PT Eval Low Complexity: 1 Low PT Treatments $Gait Training: 8-22 mins PT General Charges $$ ACUTE PT VISIT: 1 Visit         Blanchard Kelch PT Acute Rehabilitation Services Office [phone] Weekend pager-[phone]   Rada Hay 01/02/2024, 4:34 PM

## 2024-01-02 NOTE — Interval H&P Note (Signed)
 History and Physical Interval Note:  01/02/2024 9:22 AM  Elizabeth Murillo  has presented today for surgery, with the diagnosis of RIGHT HIP DEGENERATIVE JOINT DISEASE M16.11.  The various methods of treatment have been discussed with the patient and family. After consideration of risks, benefits and other options for treatment, the patient has consented to  Procedure(s) with comments: ARTHROPLASTY, HIP, TOTAL, ANTERIOR APPROACH (Right) - RIGHT TOTAL HIP ARTHROPLASTY ANTERIOR as a surgical intervention.  The patient's history has been reviewed, patient examined, no change in status, stable for surgery.  I have reviewed the patient's chart and labs.  Questions were answered to the patient's satisfaction.     [name]

## 2024-01-03 ENCOUNTER — Encounter (HOSPITAL_COMMUNITY): Payer: Self-pay | Admitting: Orthopaedic Surgery

## 2024-01-03 DIAGNOSIS — Z79899 Other long term (current) drug therapy: Secondary | ICD-10-CM | POA: Diagnosis not present

## 2024-01-03 DIAGNOSIS — Z87891 Personal history of nicotine dependence: Secondary | ICD-10-CM | POA: Diagnosis not present

## 2024-01-03 DIAGNOSIS — I10 Essential (primary) hypertension: Secondary | ICD-10-CM | POA: Diagnosis not present

## 2024-01-03 DIAGNOSIS — M1611 Unilateral primary osteoarthritis, right hip: Secondary | ICD-10-CM | POA: Diagnosis not present

## 2024-01-03 MED ORDER — SODIUM CHLORIDE 0.9 % IV BOLUS
500.0000 mL | Freq: Once | INTRAVENOUS | Status: AC
  Administered 2024-01-03: 500 mL via INTRAVENOUS

## 2024-01-03 NOTE — Progress Notes (Signed)
 Physical Therapy Treatment Patient Details Name: Elizabeth Murillo MRN: [id number] DOB: January 09, 1951 Today's Date: 01/03/2024   History of Present Illness 73 yo female , S/P R THA, direct anterior approach. PMH: htn    PT Comments   The patient reports feeling tired, minimal right hip/leg pain.  Patient ambulated practiced steps and voided.  Patient   had been upright for ~ 35'. Patient  reported feeling very  odd and felt as if she would pass out, mild nausea. BP  73/43 sitting in recliner, legs dependent. Leg rest elevated, 67/44. Recliner back rest  reclined and legs elevated on pillows. BP 93/52. RN notified MD. Will see again after receives fluids and check orthostatics. Patient had passed  PT goals, now will need a second session after receives fluids and check orthostatics.   If plan is discharge home, recommend the following: A little help with bathing/dressing/bathroom;Assistance with cooking/housework;Assist for transportation;Help with stairs or ramp for entrance   Can travel by private vehicle        Equipment Recommendations  Rolling walker (2 wheels)    Recommendations for Other Services       Precautions / Restrictions Precautions Precautions: Fall Precaution/Restrictions Comments: hypotensive Restrictions RLE Weight Bearing Per Provider Order: Weight bearing as tolerated     Mobility  Bed Mobility Overal bed mobility: Modified Independent             General bed mobility comments: mobilized with no assistance    Transfers                   General transfer comment: cues for hand placement to rise from bed    Ambulation/Gait Ambulation/Gait assistance: Contact guard assist Gait Distance (Feet): 120 Feet Assistive device: Rolling walker (2 wheels) Gait Pattern/deviations: Step-through pattern       General Gait Details: cues for sequence   Stairs Stairs: Yes Stairs assistance: Supervision Stair Management: Forwards, No rails Number of  Stairs: 1 General stair comments: practiced x 2   Wheelchair Mobility     Tilt Bed    Modified Rankin (Stroke Patients Only)       Balance Overall balance assessment: No apparent balance deficits (not formally assessed)                                          Communication    Cognition Arousal: Alert Behavior During Therapy: WFL for tasks assessed/performed   PT - Cognitive impairments: No apparent impairments                                Cueing    Exercises Total Joint Exercises Ankle Circles/Pumps: AROM, Both, 10 reps Quad Sets: Both, 10 reps, AROM, Supine Short Arc Quad: AROM, Left, 10 reps, Supine Heel Slides: AROM, Left, 10 reps, Supine Hip ABduction/ADduction: AROM, Left, 10 reps, Supine Long Arc Quad: AROM, Left, 10 reps, Seated    General Comments        Pertinent Vitals/Pain Pain Assessment Pain Score: 3  Pain Location: thigh Pain Descriptors / Indicators: Discomfort Pain Intervention(s): Monitored during session, Premedicated before session, Ice applied    Home Living                          Prior Function  PT Goals (current goals can now be found in the care plan section) Progress towards PT goals: Progressing toward goals    Frequency    7X/week      PT Plan      Co-evaluation              AM-PAC PT "6 Clicks" Mobility   Outcome Measure  Help needed turning from your back to your side while in a flat bed without using bedrails?: None Help needed moving from lying on your back to sitting on the side of a flat bed without using bedrails?: None Help needed moving to and from a bed to a chair (including a wheelchair)?: None Help needed standing up from a chair using your arms (e.g., wheelchair or bedside chair)?: A Little Help needed to walk in hospital room?: A Little Help needed climbing 3-5 steps with a railing? : A Little 6 Click Score: 21    End of Session    Activity Tolerance: Patient tolerated treatment well Patient left: in chair;with call bell/phone within reach;with chair alarm set;with nursing/sitter in room Nurse Communication: Mobility status (hyopotension) PT Visit Diagnosis: Unsteadiness on feet (R26.81);Muscle weakness (generalized) (M62.81);Pain Pain - Right/Left: Right Pain - part of body: Leg     Time: 1610-9604 PT Time Calculation (min) (ACUTE ONLY): 55 min  Charges:    $Gait Training: 23-37 mins $Therapeutic Exercise: 8-22 mins $Self Care/Home Management: 8-22 PT General Charges $$ ACUTE PT VISIT: 1 Visit                     Blanchard Kelch PT Acute Rehabilitation Services Office [phone]     Rada Hay 01/03/2024, 9:56 AM

## 2024-01-03 NOTE — Progress Notes (Signed)
 Physical Therapy Treatment Patient Details Name: Elizabeth Murillo MRN: [id number] DOB: May 17, 1951 Today's Date: 01/03/2024   History of Present Illness 73 yo female , S/P R THA, direct anterior approach. PMH: htn    PT Comments  Patient's BP stable, ambulated x 120', BP post walk 138/63. Patient has met PT goals for Dc.    If plan is discharge home, recommend the following: A little help with bathing/dressing/bathroom;Assistance with cooking/housework;Assist for transportation;Help with stairs or ramp for entrance   Can travel by private vehicle        Equipment Recommendations  Rolling walker (2 wheels)    Recommendations for Other Services       Precautions / Restrictions Precautions Precautions: Fall Restrictions RLE Weight Bearing Per Provider Order: Weight bearing as tolerated     Mobility  Bed Mobility Overal bed mobility: Modified Independent             General bed mobility comments: in recliner    Transfers   Equipment used: Rolling walker (2 wheels)               General transfer comment: cues for hand placement to rise from bed    Ambulation/Gait Ambulation/Gait assistance: Contact guard assist Gait Distance (Feet): 120 Feet Assistive device: Rolling walker (2 wheels) Gait Pattern/deviations: Step-through pattern       General Gait Details: cues for sequence   Stairs Stairs: Yes Stairs assistance: Supervision Stair Management: Forwards, No rails Number of Stairs: 1 General stair comments: practiced x 2   Wheelchair Mobility     Tilt Bed    Modified Rankin (Stroke Patients Only)       Balance Overall balance assessment: No apparent balance deficits (not formally assessed)                                          Communication    Cognition Arousal: Alert Behavior During Therapy: WFL for tasks assessed/performed   PT - Cognitive impairments: No apparent impairments                          Following commands: Intact      Cueing    Exercises Total Joint Exercises Ankle Circles/Pumps: AROM, Both, 10 reps Quad Sets: Both, 10 reps, AROM, Supine Short Arc Quad: AROM, Left, 10 reps, Supine Heel Slides: AROM, Left, 10 reps, Supine Hip ABduction/ADduction: AROM, Left, 10 reps, Supine Long Arc Quad: AROM, Left, 10 reps, Seated    General Comments        Pertinent Vitals/Pain Pain Assessment Pain Score: 4  Pain Location: thigh Pain Descriptors / Indicators: Discomfort Pain Intervention(s): Patient requesting pain meds-RN notified    Home Living                          Prior Function            PT Goals (current goals can now be found in the care plan section) Progress towards PT goals: Progressing toward goals    Frequency    7X/week      PT Plan      Co-evaluation              AM-PAC PT "6 Clicks" Mobility   Outcome Measure  Help needed turning from your back to your side while in a flat bed without  using bedrails?: None Help needed moving from lying on your back to sitting on the side of a flat bed without using bedrails?: None Help needed moving to and from a bed to a chair (including a wheelchair)?: None Help needed standing up from a chair using your arms (e.g., wheelchair or bedside chair)?: A Little Help needed to walk in hospital room?: A Little Help needed climbing 3-5 steps with a railing? : A Little 6 Click Score: 21    End of Session Equipment Utilized During Treatment: Gait belt Activity Tolerance: Patient tolerated treatment well Patient left: in chair;with call bell/phone within reach;with chair alarm set;with nursing/sitter in room;with family/visitor present Nurse Communication: Mobility status PT Visit Diagnosis: Unsteadiness on feet (R26.81);Muscle weakness (generalized) (M62.81);Pain Pain - Right/Left: Right Pain - part of body: Leg     Time: 1340-1400 PT Time Calculation (min) (ACUTE ONLY): 20  min  Charges:    $Gait Training: 8-22 mins PT General Charges $$ ACUTE PT VISIT: 1 Visit                     Blanchard Kelch PT Acute Rehabilitation Services Office [phone] Weekend [phone]    Rada Hay 01/03/2024, 4:18 PM

## 2024-01-03 NOTE — Discharge Summary (Signed)
 Patient ID: Elizabeth Murillo MRN: [id number] DOB/AGE: 06/11/1951 73 y.o.  Admit date: 01/02/2024 Discharge date: 01/03/2024  Admission Diagnoses:  Principal Problem:   Primary localized osteoarthritis of right hip Active Problems:   Primary osteoarthritis of right hip   Discharge Diagnoses:  Same  Past Medical History:  Diagnosis Date   Anxiety    Arthritis    HLD (hyperlipidemia)    Hypertension     Surgeries: Procedure(s): ARTHROPLASTY, HIP, TOTAL, ANTERIOR APPROACH on 01/02/2024   Consultants:   Discharged Condition: Improved  Hospital Course: Elizabeth Murillo is an 73 y.o. female who was admitted 01/02/2024 for operative treatment ofPrimary localized osteoarthritis of right hip. Patient has severe unremitting pain that affects sleep, daily activities, and work/hobbies. After pre-op clearance the patient was taken to the operating room on 01/02/2024 and underwent  Procedure(s): ARTHROPLASTY, HIP, TOTAL, ANTERIOR APPROACH.    Patient was given perioperative antibiotics:  Anti-infectives (From admission, onward)    Start     Dose/Rate Route Frequency Ordered Stop   01/02/24 1630  ceFAZolin (ANCEF) IVPB 2g/100 mL premix        2 g 200 mL/hr over 30 Minutes Intravenous Every 6 hours 01/02/24 1310 01/02/24 2159   01/02/24 0845  ceFAZolin (ANCEF) IVPB 2g/100 mL premix        2 g 200 mL/hr over 30 Minutes Intravenous On call to O.R. 01/02/24 0835 01/02/24 1031        Patient was given sequential compression devices, early ambulation, and chemoprophylaxis to prevent DVT.  Patient benefited maximally from hospital stay and there were no complications.    Recent vital signs: Patient Vitals for the past 24 hrs:  BP Temp Temp src Pulse Resp SpO2 Height Weight  01/03/24 0559 (!) 149/63 98.6 F (37 C) Oral 62 16 98 % -- --  01/03/24 0127 135/62 98.2 F (36.8 C) Oral (!) 54 16 96 % -- --  01/02/24 2216 (!) 145/73 98.6 F (37 C) Oral (!) 57 16 98 % -- --  01/02/24 1316 (!) 146/63 97.8 F  (36.6 C) Oral (!) 48 15 99 % -- --  01/02/24 1300 (!) 145/60 (!) 97.5 F (36.4 C) -- (!) 48 12 99 % -- --  01/02/24 1245 132/65 -- -- (!) 46 14 99 % -- --  01/02/24 1230 127/64 -- -- (!) 47 10 100 % -- --  01/02/24 1215 (!) 115/56 -- -- (!) 52 17 95 % -- --  01/02/24 1200 97/61 (!) 97.5 F (36.4 C) -- 70 (!) 21 100 % -- --  01/02/24 0938 -- -- -- -- -- -- 5\' 5"  (1.651 m) 65.8 kg     Recent laboratory studies: No results for input(s): "WBC", "HGB", "HCT", "PLT", "NA", "K", "CL", "CO2", "BUN", "CREATININE", "GLUCOSE", "INR", "CALCIUM" in the last 72 hours.  Invalid input(s): "PT", "2"   Discharge Medications:   Allergies as of 01/03/2024   No Known Allergies      Medication List     STOP taking these medications    ibuprofen 200 MG tablet Commonly known as: ADVIL       TAKE these medications    ascorbic acid 500 MG tablet Commonly known as: VITAMIN C Take 500 mg by mouth daily.   aspirin EC 81 MG tablet Take 1 tablet (81 mg total) by mouth 2 (two) times daily after a meal. For 2 weeks then once a day for 2 weeks for DVT prevention.   b complex vitamins capsule Take 1 capsule by  mouth daily.   Chelated Magnesium 100 MG Tabs Take 200 mg by mouth daily.   chlorhexidine 4 % external liquid Commonly known as: HIBICLENS Apply 15 mLs (1 Application total) topically as directed for 30 doses. Use as directed daily for 5 days every other week for 6 weeks.   HYDROcodone-acetaminophen 5-325 MG tablet Commonly known as: NORCO/VICODIN Take 1-2 tablets by mouth every 6 (six) hours as needed for moderate pain (pain score 4-6) or severe pain (pain score 7-10) (post op pain).   multivitamin with minerals tablet Take 1 tablet by mouth daily.   mupirocin ointment 2 % Commonly known as: BACTROBAN Place 1 Application into the nose 2 (two) times daily for 60 doses. Use as directed 2 times daily for 5 days every other week for 6 weeks.   rosuvastatin 10 MG tablet Commonly known  as: CRESTOR Take 10 mg by mouth daily.   tiZANidine 4 MG tablet Commonly known as: Zanaflex Take 1 tablet (4 mg total) by mouth every 6 (six) hours as needed for muscle spasms.   Turmeric 500 MG Tabs Take 500 mg by mouth daily.               Durable Medical Equipment  (From admission, onward)           Start     Ordered   01/02/24 1311  DME Walker rolling  Once       Question:  Patient needs a walker to treat with the following condition  Answer:  Primary osteoarthritis of right hip   01/02/24 1310   01/02/24 1311  DME 3 n 1  Once        01/02/24 1310   01/02/24 1311  DME Bedside commode  Once       Question:  Patient needs a bedside commode to treat with the following condition  Answer:  Primary osteoarthritis of right hip   01/02/24 1310            Diagnostic Studies: DG HIP UNILAT WITH PELVIS 1V RIGHT Result Date: 01/02/2024 CLINICAL DATA:  Right hip arthroplasty EXAM: DG HIP (WITH OR WITHOUT PELVIS) 1V RIGHT COMPARISON:  11/14/2023 FINDINGS: 2 fluoroscopic images are obtained during the performance of the procedure and are provided for interpretation only. Right hip arthroplasty is identified in grossly normal position. Please refer to the operative report. Fluoroscopy time: 12 seconds, 1.5 mGy IMPRESSION: 1. Right hip arthroplasty.  Please refer to operative report. Electronically Signed   By: Sharlet Salina M.D.   On: 01/02/2024 15:21   DG C-Arm 1-60 Min-No Report Result Date: 01/02/2024 Fluoroscopy was utilized by the requesting physician.  No radiographic interpretation.   DG C-Arm 1-60 Min-No Report Result Date: 01/02/2024 Fluoroscopy was utilized by the requesting physician.  No radiographic interpretation.    Disposition: Discharge disposition: 01-Home or Self Care       Discharge Instructions     Call MD / Call 911   Complete by: As directed    If you experience chest pain or shortness of breath, CALL 911 and be transported to the hospital  emergency room.  If you develope a fever above 101 F, pus (white drainage) or increased drainage or redness at the wound, or calf pain, call your surgeon's office.   Constipation Prevention   Complete by: As directed    Drink plenty of fluids.  Prune juice may be helpful.  You may use a stool softener, such as Colace (over the counter) 100 mg twice  a day.  Use MiraLax (over the counter) for constipation as needed.   Diet - low sodium heart healthy   Complete by: As directed    Discharge instructions   Complete by: As directed    INSTRUCTIONS AFTER JOINT REPLACEMENT   Remove items at home which could result in a fall. This includes throw rugs or furniture in walking pathways ICE to the affected joint every three hours while awake for 30 minutes at a time, for at least the first 3-5 days, and then as needed for pain and swelling.  Continue to use ice for pain and swelling. You may notice swelling that will progress down to the foot and ankle.  This is normal after surgery.  Elevate your leg when you are not up walking on it.   Continue to use the breathing machine you got in the hospital (incentive spirometer) which will help keep your temperature down.  It is common for your temperature to cycle up and down following surgery, especially at night when you are not up moving around and exerting yourself.  The breathing machine keeps your lungs expanded and your temperature down.   DIET:  As you were doing prior to hospitalization, we recommend a well-balanced diet.  DRESSING / WOUND CARE / SHOWERING  You may shower 3 days after surgery, but keep the wounds dry during showering.  You may use an occlusive plastic wrap (Press'n Seal for example), NO SOAKING/SUBMERGING IN THE BATHTUB.  If the bandage gets wet, change with a clean dry gauze.  If the incision gets wet, pat the wound dry with a clean towel.  ACTIVITY  Increase activity slowly as tolerated, but follow the weight bearing instructions  below.   No driving for 6 weeks or until further direction given by your physician.  You cannot drive while taking narcotics.  No lifting or carrying greater than 10 lbs. until further directed by your surgeon. Avoid periods of inactivity such as sitting longer than an hour when not asleep. This helps prevent blood clots.  You may return to work once you are authorized by your doctor.     WEIGHT BEARING   Weight bearing as tolerated with assist device (walker, cane, etc) as directed, use it as long as suggested by your surgeon or therapist, typically at least 4-6 weeks.   EXERCISES  Results after joint replacement surgery are often greatly improved when you follow the exercise, range of motion and muscle strengthening exercises prescribed by your doctor. Safety measures are also important to protect the joint from further injury. Any time any of these exercises cause you to have increased pain or swelling, decrease what you are doing until you are comfortable again and then slowly increase them. If you have problems or questions, call your caregiver or physical therapist for advice.   Rehabilitation is important following a joint replacement. After just a few days of immobilization, the muscles of the leg can become weakened and shrink (atrophy).  These exercises are designed to build up the tone and strength of the thigh and leg muscles and to improve motion. Often times heat used for twenty to thirty minutes before working out will loosen up your tissues and help with improving the range of motion but do not use heat for the first two weeks following surgery (sometimes heat can increase post-operative swelling).   These exercises can be done on a training (exercise) mat, on the floor, on a table or on a bed. Use whatever works  the best and is most comfortable for you.    Use music or television while you are exercising so that the exercises are a pleasant break in your day. This will make your  life better with the exercises acting as a break in your routine that you can look forward to.   Perform all exercises about fifteen times, three times per day or as directed.  You should exercise both the operative leg and the other leg as well.  Exercises include:   Quad Sets - Tighten up the muscle on the front of the thigh (Quad) and hold for 5-10 seconds.   Straight Leg Raises - With your knee straight (if you were given a brace, keep it on), lift the leg to 60 degrees, hold for 3 seconds, and slowly lower the leg.  Perform this exercise against resistance later as your leg gets stronger.  Leg Slides: Lying on your back, slowly slide your foot toward your buttocks, bending your knee up off the floor (only go as far as is comfortable). Then slowly slide your foot back down until your leg is flat on the floor again.  Angel Wings: Lying on your back spread your legs to the side as far apart as you can without causing discomfort.  Hamstring Strength:  Lying on your back, push your heel against the floor with your leg straight by tightening up the muscles of your buttocks.  Repeat, but this time bend your knee to a comfortable angle, and push your heel against the floor.  You may put a pillow under the heel to make it more comfortable if necessary.   A rehabilitation program following joint replacement surgery can speed recovery and prevent re-injury in the future due to weakened muscles. Contact your doctor or a physical therapist for more information on knee rehabilitation.    CONSTIPATION  Constipation is defined medically as fewer than three stools per week and severe constipation as less than one stool per week.  Even if you have a regular bowel pattern at home, your normal regimen is likely to be disrupted due to multiple reasons following surgery.  Combination of anesthesia, postoperative narcotics, change in appetite and fluid intake all can affect your bowels.   YOU MUST use at least one of  the following options; they are listed in order of increasing strength to get the job done.  They are all available over the counter, and you may need to use some, POSSIBLY even all of these options:    Drink plenty of fluids (prune juice may be helpful) and high fiber foods Colace 100 mg by mouth twice a day  Senokot for constipation as directed and as needed Dulcolax (bisacodyl), take with full glass of water  Miralax (polyethylene glycol) once or twice a day as needed.  If you have tried all these things and are unable to have a bowel movement in the first 3-4 days after surgery call either your surgeon or your primary doctor.    If you experience loose stools or diarrhea, hold the medications until you stool forms back up.  If your symptoms do not get better within 1 week or if they get worse, check with your doctor.  If you experience "the worst abdominal pain ever" or develop nausea or vomiting, please contact the office immediately for further recommendations for treatment.   ITCHING:  If you experience itching with your medications, try taking only a single pain pill, or even half a pain pill at  a time.  You can also use Benadryl over the counter for itching or also to help with sleep.   TED HOSE STOCKINGS:  Use stockings on both legs until for at least 2 weeks or as directed by physician office. They may be removed at night for sleeping.  MEDICATIONS:  See your medication summary on the "After Visit Summary" that nursing will review with you.  You may have some home medications which will be placed on hold until you complete the course of blood thinner medication.  It is important for you to complete the blood thinner medication as prescribed.  PRECAUTIONS:  If you experience chest pain or shortness of breath - call 911 immediately for transfer to the hospital emergency department.   If you develop a fever greater that 101 F, purulent drainage from wound, increased redness or drainage  from wound, foul odor from the wound/dressing, or calf pain - CONTACT YOUR SURGEON.                                                   FOLLOW-UP APPOINTMENTS:  If you do not already have a post-op appointment, please call the office for an appointment to be seen by your surgeon.  Guidelines for how soon to be seen are listed in your "After Visit Summary", but are typically between 1-4 weeks after surgery.  OTHER INSTRUCTIONS:   Knee Replacement:  Do not place pillow under knee, focus on keeping the knee straight while resting. CPM instructions: 0-90 degrees, 2 hours in the morning, 2 hours in the afternoon, and 2 hours in the evening. Place foam block, curve side up under heel at all times except when in CPM or when walking.  DO NOT modify, tear, cut, or change the foam block in any way.  POST-OPERATIVE OPIOID TAPER INSTRUCTIONS: It is important to wean off of your opioid medication as soon as possible. If you do not need pain medication after your surgery it is ok to stop day one. Opioids include: Codeine, Hydrocodone(Norco, Vicodin), Oxycodone(Percocet, oxycontin) and hydromorphone amongst others.  Long term and even short term use of opiods can cause: Increased pain response Dependence Constipation Depression Respiratory depression And more.  Withdrawal symptoms can include Flu like symptoms Nausea, vomiting And more Techniques to manage these symptoms Hydrate well Eat regular healthy meals Stay active Use relaxation techniques(deep breathing, meditating, yoga) Do Not substitute Alcohol to help with tapering If you have been on opioids for less than two weeks and do not have pain than it is ok to stop all together.  Plan to wean off of opioids This plan should start within one week post op of your joint replacement. Maintain the same interval or time between taking each dose and first decrease the dose.  Cut the total daily intake of opioids by one tablet each day Next start to  increase the time between doses. The last dose that should be eliminated is the evening dose.     MAKE SURE YOU:  Understand these instructions.  Get help right away if you are not doing well or get worse.    Thank you for letting us be a part of your medical care team.  It is a privilege we respect greatly.  We hope these instructions will help you stay on track for a fast and full recovery!  Increase activity slowly as tolerated   Complete by: As directed    Post-operative opioid taper instructions:   Complete by: As directed    POST-OPERATIVE OPIOID TAPER INSTRUCTIONS: It is important to wean off of your opioid medication as soon as possible. If you do not need pain medication after your surgery it is ok to stop day one. Opioids include: Codeine, Hydrocodone(Norco, Vicodin), Oxycodone(Percocet, oxycontin) and hydromorphone amongst others.  Long term and even short term use of opiods can cause: Increased pain response Dependence Constipation Depression Respiratory depression And more.  Withdrawal symptoms can include Flu like symptoms Nausea, vomiting And more Techniques to manage these symptoms Hydrate well Eat regular healthy meals Stay active Use relaxation techniques(deep breathing, meditating, yoga) Do Not substitute Alcohol to help with tapering If you have been on opioids for less than two weeks and do not have pain than it is ok to stop all together.  Plan to wean off of opioids This plan should start within one week post op of your joint replacement. Maintain the same interval or time between taking each dose and first decrease the dose.  Cut the total daily intake of opioids by one tablet each day Next start to increase the time between doses. The last dose that should be eliminated is the evening dose.           Follow-up Information     Marcene Corning, MD. Go on 01/12/2024.   Specialty: Orthopedic Surgery Why: Your appointment is scheduled for  10:00 Contact information: [address] [place] [address] [phone]         [facility] Go on 01/05/2024.   Why: Your outpatient physical therapy is scheduled for 2:20. Please arrive at 2:00 to complete paperwork Contact information: Physical Therapy [address] [place] [address] [phone]                  Signed: Ginger Organ Gwenith Tschida 01/03/2024, 8:24 AM

## 2024-01-03 NOTE — TOC Transition Note (Signed)
 Transition of Care [facility]) - Discharge Note   Patient Details  Name: Elizabeth Murillo MRN: [id number] Date of Birth: [birth year]  Transition [facility] CM/SW Contact:  Howell Rucks, RN Phone Number: 01/03/2024, 12:04 PM   Clinical Narrative:   Met with patient at bedside to review dc therapy and home equipment needs, pt confirmed OPPT, RW delivered to bedside by [employer]. No TOC needs.       Barriers to Discharge: No Barriers Identified   Patient Goals and CMS Choice Patient states their goals for this hospitalization and ongoing recovery are:: return home          Discharge Placement                       Discharge Plan and Services Additional resources added to the After Visit Summary for                  DME Arranged: Walker rolling DME Agency: [employer]                  Social Drivers of Health (SDOH) Interventions SDOH Screenings   Food Insecurity: Patient Declined (01/02/2024)  Housing: Patient Declined (01/02/2024)  Transportation Needs: Patient Declined (01/02/2024)  Utilities: Patient Declined (01/02/2024)  Social Connections: Patient Declined (01/02/2024)  Tobacco Use: Medium Risk (01/02/2024)     Readmission Risk Interventions     No data to display

## 2024-01-03 NOTE — Progress Notes (Signed)
 Subjective: 1 Day Post-Op Procedure(s) (LRB): ARTHROPLASTY, HIP, TOTAL, ANTERIOR APPROACH (Right)  Patient doing well. She is hoping to go home today.  Activity level:  wbat Diet tolerance:  ok Voiding:  ok Patient reports pain as mild.    Objective: Vital signs in last 24 hours: Temp:  [97.5 F (36.4 C)-98.6 F (37 C)] 98.6 F (37 C) (04/02 0559) Pulse Rate:  [46-70] 62 (04/02 0559) Resp:  [10-21] 16 (04/02 0559) BP: (97-149)/(56-73) 149/63 (04/02 0559) SpO2:  [95 %-100 %] 98 % (04/02 0559) Weight:  [65.8 kg] 65.8 kg (04/01 0938)  Labs: No results for input(s): "HGB" in the last 72 hours. No results for input(s): "WBC", "RBC", "HCT", "PLT" in the last 72 hours. No results for input(s): "NA", "K", "CL", "CO2", "BUN", "CREATININE", "GLUCOSE", "CALCIUM" in the last 72 hours. No results for input(s): "LABPT", "INR" in the last 72 hours.  Physical Exam:  Neurologically intact ABD soft Neurovascular intact Sensation intact distally Intact pulses distally Dorsiflexion/Plantar flexion intact Incision: dressing C/D/I and no drainage No cellulitis present Compartment soft  Assessment/Plan:  1 Day Post-Op Procedure(s) (LRB): ARTHROPLASTY, HIP, TOTAL, ANTERIOR APPROACH (Right) Advance diet Up with therapy D/C IV fluids Discharge home with home health today after PT if cleared and doing well. Follow up in office 2 weeks post op. Continue on 81mg  asa BID for 2 weeks then once a day for 2 weeks for DVT prevention.    Ginger Organ Jereme Loren 01/03/2024, 8:22 AM

## 2024-01-03 NOTE — Care Management Obs Status (Signed)
 MEDICARE OBSERVATION STATUS NOTIFICATION   Patient Details  Name: Tabia Landowski MRN: [id number] Date of Birth: 01-04-1951   Medicare Observation Status Notification Given:  Yes    Howell Rucks, RN 01/03/2024, 10:14 AM

## 2024-01-05 DIAGNOSIS — Z96641 Presence of right artificial hip joint: Secondary | ICD-10-CM | POA: Diagnosis not present

## 2024-01-05 DIAGNOSIS — M25651 Stiffness of right hip, not elsewhere classified: Secondary | ICD-10-CM | POA: Diagnosis not present

## 2024-01-08 DIAGNOSIS — Z96641 Presence of right artificial hip joint: Secondary | ICD-10-CM | POA: Diagnosis not present

## 2024-01-08 DIAGNOSIS — M25651 Stiffness of right hip, not elsewhere classified: Secondary | ICD-10-CM | POA: Diagnosis not present

## 2024-01-09 ENCOUNTER — Ambulatory Visit (INDEPENDENT_AMBULATORY_CARE_PROVIDER_SITE_OTHER): Payer: Medicare PPO | Admitting: Psychology

## 2024-01-09 DIAGNOSIS — F4323 Adjustment disorder with mixed anxiety and depressed mood: Secondary | ICD-10-CM

## 2024-01-09 NOTE — Progress Notes (Signed)
 [facility] Counselor/Therapist Progress Note  Patient ID: Elizabeth Murillo, MRN: [id number],    Date: 01/09/2024  Time Spent: 3:00pm-3:55pm   55 minutes   Treatment Type: Individual Therapy  Reported Symptoms: anxiety, worrying  Mental Status Exam: Appearance:  Casual     Behavior: Appropriate  Motor: Normal  Speech/Language:  Normal Rate  Affect: Appropriate  Mood: normal  Thought process: normal  Thought content:   WNL  Sensory/Perceptual disturbances:   WNL  Orientation: oriented to person, place, time/date, and situation  Attention: Good  Concentration: Good  Memory: WNL  Fund of knowledge:  Good  Insight:   Good  Judgment:  Good  Impulse Control: Good   Risk Assessment: Danger to Self:  No Self-injurious Behavior: No Danger to Others: No Duty to Warn:no Physical Aggression / Violence:No  Access to Firearms a concern: No  Gang Involvement:No   Subjective: Pt present for face-to-face individual therapy via video.  Pt consents to telehealth video session and is aware of limitations and benefits of virtual sessions.   Location of pt: home Location of therapist: home office.   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   Pt's friends helped pt post op.  Pt's pain is managed and she is progressing as expected.   Pt is using a walker and getting physical therapy 3 times a week.   Pt talked about her friend Zella Ball who stayed with her for 4 days after pt's surgery.  Pt felt like Zella Ball was not engaged with her and was disappointed in the amount of help she provided.   Pt is not sure why Zella Ball was disengaged but she has been worried that Zella Ball is showing signs of dementia.   Addressed the relationship dynamics and pt's concerns.   Pt talked about feeling anxious about the political situation and state of the country.   Worked on thought reframing.   Worked on calming strategies. Pt states she is working on positivity and forward  movement.   Pt and niece are planning a trip to [place] for May 9th-15th.   Pt is looking forward to this trip and the hopes she will progress enough in her surgery rehab to be able to have good mobility.     Worked on self care strategies.   Provided supportive therapy.     Interventions: Cognitive Behavioral Therapy and Insight-Oriented  Diagnosis:  F43.23  Plan of Care: Recommend ongoing therapy.  Pt participated in setting treatment goals.  Pt wants to talk through life stressors.  She wants to figure out what her next project will be.  Pt wants help managing anxiety about politics.   Pt [place]  Pt gets caught up in "what if" thinking about fears and wants to work on thought reframing.   Plan to meet every two weeks.  Pt agrees with treatment plan.    Treatment Plan Client Abilities/Strengths  Pt is bright, engaging, and motivated for therapy.  Client Treatment Preferences  Individual therapy.  Client Statement of Needs  Improve copings skills.  Addressed life stressors. Symptoms  Depressed or irritable mood. Excessive and/or unrealistic worry that is difficult to control occurring more days than not for at least 6 months about a number of events or activities. Hypervigilance (e.g., feeling constantly on edge, experiencing concentration difficulties, having trouble falling or staying asleep, exhibiting a general state of irritability). Low self-esteem. Problems Addressed  Unipolar Depression, Anxiety Goals 1. Alleviate  depressive symptoms and return to previous level of effective functioning. 2. Appropriately grieve the loss in order to normalize mood and to return to previously adaptive level of functioning. Objective Learn and implement behavioral strategies to overcome depression. Target Date: 2024-10-23 Frequency: Biweekly  Progress: 10 Modality: individual  Related Interventions Assist the client in developing skills that increase the  likelihood of deriving pleasure from behavioral activation (e.g., assertiveness skills, developing an exercise plan, less internal/more external focus, increased social involvement); reinforce success. Engage the client in "behavioral activation," increasing his/her activity level and contact with sources of reward, while identifying processes that inhibit activation. use behavioral techniques such as instruction, rehearsal, role-playing, role reversal, as needed, to facilitate activity in the client's daily life; reinforce success. 3. Develop healthy interpersonal relationships that lead to the alleviation and help prevent the relapse of depression. 4. Develop healthy thinking patterns and beliefs about self, others, and the world that lead to the alleviation and help prevent the relapse of depression. 5. Enhance ability to effectively cope with the full variety of life's worries and anxieties. 6. Learn and implement coping skills that result in a reduction of anxiety and worry, and improved daily functioning. Objective Learn and implement problem-solving strategies for realistically addressing worries. Target Date: 2024-10-23 Frequency: Biweekly  Progress: 10 Modality: individual  Related Interventions Assign the client a homework exercise in which he/she problem-solves a current problem (see Mastery of Your Anxiety and Worry: Workbook by Elenora Fender and Filbert Schilder or Generalized Anxiety Disorder by Elesa Hacker, and Filbert Schilder); review, reinforce success, and provide corrective feedback toward improvement. Teach the client problem-solving strategies involving specifically defining a problem, generating options for addressing it, evaluating the pros and cons of each option, selecting and implementing an optional action, and reevaluating and refining the action. Objective Learn and implement calming skills to reduce overall anxiety and manage anxiety symptoms. Target Date: 2024-10-23 Frequency: Biweekly   Progress: 10 Modality: individual  Related Interventions Assign the client to read about progressive muscle relaxation and other calming strategies in relevant books or treatment manuals (e.g., Progressive Relaxation Training by Twana First; Mastery of Your Anxiety and Worry: Workbook by Earlie Counts). Assign the client homework each session in which he/she practices relaxation exercises daily, gradually applying them progressively from non-anxiety-provoking to anxiety-provoking situations; review and reinforce success while providing corrective feedback toward improvement. Teach the client calming/relaxation skills (e.g., applied relaxation, progressive muscle relaxation, cue controlled relaxation; mindful breathing; biofeedback) and how to discriminate better between relaxation and tension; teach the client how to apply these skills to his/her daily life. 7. Recognize, accept, and cope with feelings of depression. 8. Reduce overall frequency, intensity, and duration of the anxiety so that daily functioning is not impaired. 9. Resolve the core conflict that is the source of anxiety. 10. Stabilize anxiety level while increasing ability to function on a daily basis. Diagnosis F43.23  Salomon Fick, LCSW

## 2024-01-10 DIAGNOSIS — Z96641 Presence of right artificial hip joint: Secondary | ICD-10-CM | POA: Diagnosis not present

## 2024-01-10 DIAGNOSIS — M25651 Stiffness of right hip, not elsewhere classified: Secondary | ICD-10-CM | POA: Diagnosis not present

## 2024-01-11 DIAGNOSIS — M25651 Stiffness of right hip, not elsewhere classified: Secondary | ICD-10-CM | POA: Diagnosis not present

## 2024-01-11 DIAGNOSIS — Z96641 Presence of right artificial hip joint: Secondary | ICD-10-CM | POA: Diagnosis not present

## 2024-01-12 DIAGNOSIS — M25651 Stiffness of right hip, not elsewhere classified: Secondary | ICD-10-CM | POA: Diagnosis not present

## 2024-01-15 DIAGNOSIS — M25651 Stiffness of right hip, not elsewhere classified: Secondary | ICD-10-CM | POA: Diagnosis not present

## 2024-01-15 DIAGNOSIS — Z96641 Presence of right artificial hip joint: Secondary | ICD-10-CM | POA: Diagnosis not present

## 2024-01-16 DIAGNOSIS — M25651 Stiffness of right hip, not elsewhere classified: Secondary | ICD-10-CM | POA: Diagnosis not present

## 2024-01-16 DIAGNOSIS — Z96641 Presence of right artificial hip joint: Secondary | ICD-10-CM | POA: Diagnosis not present

## 2024-01-18 DIAGNOSIS — Z96641 Presence of right artificial hip joint: Secondary | ICD-10-CM | POA: Diagnosis not present

## 2024-01-18 DIAGNOSIS — M25651 Stiffness of right hip, not elsewhere classified: Secondary | ICD-10-CM | POA: Diagnosis not present

## 2024-01-22 DIAGNOSIS — Z96641 Presence of right artificial hip joint: Secondary | ICD-10-CM | POA: Diagnosis not present

## 2024-01-22 DIAGNOSIS — M25651 Stiffness of right hip, not elsewhere classified: Secondary | ICD-10-CM | POA: Diagnosis not present

## 2024-01-23 ENCOUNTER — Ambulatory Visit (INDEPENDENT_AMBULATORY_CARE_PROVIDER_SITE_OTHER): Payer: Medicare PPO | Admitting: Psychology

## 2024-01-23 DIAGNOSIS — F4323 Adjustment disorder with mixed anxiety and depressed mood: Secondary | ICD-10-CM
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
## 2024-01-24 DIAGNOSIS — Z96641 Presence of right artificial hip joint: Secondary | ICD-10-CM | POA: Diagnosis not present

## 2024-01-24 DIAGNOSIS — M25651 Stiffness of right hip, not elsewhere classified: Secondary | ICD-10-CM | POA: Diagnosis not present

## 2024-01-29 DIAGNOSIS — Z96641 Presence of right artificial hip joint: Secondary | ICD-10-CM | POA: Diagnosis not present

## 2024-01-29 DIAGNOSIS — M25651 Stiffness of right hip, not elsewhere classified: Secondary | ICD-10-CM | POA: Diagnosis not present

## 2024-02-01 DIAGNOSIS — Z96641 Presence of right artificial hip joint: Secondary | ICD-10-CM | POA: Diagnosis not present

## 2024-02-01 DIAGNOSIS — M25651 Stiffness of right hip, not elsewhere classified: Secondary | ICD-10-CM | POA: Diagnosis not present

## 2024-02-06 ENCOUNTER — Ambulatory Visit (INDEPENDENT_AMBULATORY_CARE_PROVIDER_SITE_OTHER): Admitting: Psychology

## 2024-02-06 DIAGNOSIS — M25651 Stiffness of right hip, not elsewhere classified: Secondary | ICD-10-CM | POA: Diagnosis not present

## 2024-02-06 DIAGNOSIS — Z96641 Presence of right artificial hip joint: Secondary | ICD-10-CM | POA: Diagnosis not present

## 2024-02-06 DIAGNOSIS — F4323 Adjustment disorder with mixed anxiety and depressed mood: Secondary | ICD-10-CM

## 2024-02-06 NOTE — Progress Notes (Signed)
 [facility] Counselor/Therapist Progress Note  Patient ID: Elizabeth Murillo, MRN: [id number],    Date: 02/06/2024  Time Spent: 4:00pm-4:50pm   50 minutes   Treatment Type: Individual Therapy  Reported Symptoms: anxiety  Mental Status Exam: Appearance:  Casual     Behavior: Appropriate  Motor: Normal  Speech/Language:  Normal Rate  Affect: Appropriate  Mood: normal  Thought process: normal  Thought content:   WNL  Sensory/Perceptual disturbances:   WNL  Orientation: oriented to person, place, time/date, and situation  Attention: Good  Concentration: Good  Memory: WNL  Fund of knowledge:  Good  Insight:   Good  Judgment:  Good  Impulse Control: Good   Risk Assessment: Danger to Self:  No Self-injurious Behavior: No Danger to Others: No Duty to Warn:no Physical Aggression / Violence:No  Access to Firearms a concern: No  Gang Involvement:No   Subjective: Pt present for face-to-face individual therapy via video.  Pt consents to telehealth video session and is aware of limitations and benefits of virtual sessions.   Location of pt: home Location of therapist: home office.   Pt talked about healing from her hip surgery.  She is doing well and leaves for [place] in 3 days.  Pt is traveling to [place] with her niece who is a doctor so she feels like she will have what she needs.   Pt feels fortunate to be able to go on this trip.   Pt talked about feeling anxious about the political situation and state of the country.  Addressed pt's concerns. Worked on thought reframing.   Worked on calming strategies. 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   She is trying to not get frustrated with waiting for him.   Pt talked about planning to move to [place]  within the next year.   This will be a big decision and move since she has been in [place] for 30 years.   Pt states she will need to process that decision  making in therapy going forward.   Worked on self care strategies.   Provided supportive therapy.     Interventions: Cognitive Behavioral Therapy and Insight-Oriented  Diagnosis:  F43.23  Plan of Care: Recommend ongoing therapy.  Pt participated in setting treatment goals.  Pt wants to talk through life stressors.  She wants to figure out what her next project will be.  Pt wants help managing anxiety about politics.   Pt wants help with her decision to move north.  Pt gets caught up in "what if" thinking about fears and wants to work on thought reframing.   Plan to meet every two weeks.  Pt agrees with treatment plan.    Treatment Plan Client Abilities/Strengths  Pt is bright, engaging, and motivated for therapy.  Client Treatment Preferences  Individual therapy.  Client Statement of Needs  Improve copings skills.  Addressed life stressors. Symptoms  Depressed or irritable mood. Excessive and/or unrealistic worry that is difficult to control occurring more days than not for at least 6 months about a number of events or activities. Hypervigilance (e.g., feeling constantly on edge, experiencing concentration difficulties, having trouble falling or staying asleep, exhibiting a general state of irritability). Low self-esteem. Problems Addressed  Unipolar Depression, Anxiety Goals 1. Alleviate depressive symptoms and return to previous level of effective functioning. 2. Appropriately grieve the loss in order to normalize mood and to return to previously adaptive level  of functioning. Objective Learn and implement behavioral strategies to overcome depression. Target Date: 2024-10-23 Frequency: Biweekly  Progress: 10 Modality: individual  Related Interventions Assist the client in developing skills that increase the likelihood of deriving pleasure from behavioral activation (e.g., assertiveness skills, developing an exercise plan, less internal/more external focus, increased social  involvement); reinforce success. Engage the client in "behavioral activation," increasing his/her activity level and contact with sources of reward, while identifying processes that inhibit activation. use behavioral techniques such as instruction, rehearsal, role-playing, role reversal, as needed, to facilitate activity in the client's daily life; reinforce success. 3. Develop healthy interpersonal relationships that lead to the alleviation and help prevent the relapse of depression. 4. Develop healthy thinking patterns and beliefs about self, others, and the world that lead to the alleviation and help prevent the relapse of depression. 5. Enhance ability to effectively cope with the full variety of life's worries and anxieties. 6. Learn and implement coping skills that result in a reduction of anxiety and worry, and improved daily functioning. Objective Learn and implement problem-solving strategies for realistically addressing worries. Target Date: 2024-10-23 Frequency: Biweekly  Progress: 10 Modality: individual  Related Interventions Assign the client a homework exercise in which he/she problem-solves a current problem (see Mastery of Your Anxiety and Worry: Workbook by Colbert Dates and Edna Gouty or Generalized Anxiety Disorder by Woodson He, and Edna Gouty); review, reinforce success, and provide corrective feedback toward improvement. Teach the client problem-solving strategies involving specifically defining a problem, generating options for addressing it, evaluating the pros and cons of each option, selecting and implementing an optional action, and reevaluating and refining the action. Objective Learn and implement calming skills to reduce overall anxiety and manage anxiety symptoms. Target Date: 2024-10-23 Frequency: Biweekly  Progress: 10 Modality: individual  Related Interventions Assign the client to read about progressive muscle relaxation and other calming strategies in relevant books or  treatment manuals (e.g., Progressive Relaxation Training by Juleen Oakland; Mastery of Your Anxiety and Worry: Workbook by Rodney Clamp). Assign the client homework each session in which he/she practices relaxation exercises daily, gradually applying them progressively from non-anxiety-provoking to anxiety-provoking situations; review and reinforce success while providing corrective feedback toward improvement. Teach the client calming/relaxation skills (e.g., applied relaxation, progressive muscle relaxation, cue controlled relaxation; mindful breathing; biofeedback) and how to discriminate better between relaxation and tension; teach the client how to apply these skills to his/her daily life. 7. Recognize, accept, and cope with feelings of depression. 8. Reduce overall frequency, intensity, and duration of the anxiety so that daily functioning is not impaired. 9. Resolve the core conflict that is the source of anxiety. 10. Stabilize anxiety level while increasing ability to function on a daily basis. Diagnosis F43.23  Willey Harrier, LCSW

## 2024-02-08 DIAGNOSIS — M25651 Stiffness of right hip, not elsewhere classified: Secondary | ICD-10-CM | POA: Diagnosis not present

## 2024-02-08 DIAGNOSIS — Z96641 Presence of right artificial hip joint: Secondary | ICD-10-CM | POA: Diagnosis not present

## 2024-03-01 ENCOUNTER — Ambulatory Visit: Admitting: Psychology

## 2024-03-01 DIAGNOSIS — F4323 Adjustment disorder with mixed anxiety and depressed mood: Secondary | ICD-10-CM

## 2024-03-01 NOTE — Progress Notes (Signed)
 [facility] Counselor/Therapist Progress Note  Patient ID: Elizabeth Murillo, MRN: [id number],    Date: 03/01/2024  Time Spent: 2:00pm-2:50pm   50 minutes   Treatment Type: Individual Therapy  Reported Symptoms: anxiety  Mental Status Exam: Appearance:  Casual     Behavior: Appropriate  Motor: Normal  Speech/Language:  Normal Rate  Affect: Appropriate  Mood: normal  Thought process: normal  Thought content:   WNL  Sensory/Perceptual disturbances:   WNL  Orientation: oriented to person, place, time/date, and situation  Attention: Good  Concentration: Good  Memory: WNL  Fund of knowledge:  Good  Insight:   Good  Judgment:  Good  Impulse Control: Good   Risk Assessment: Danger to Self:  No Self-injurious Behavior: No Danger to Others: No Duty to Warn:no Physical Aggression / Violence:No  Access to Firearms a concern: No  Gang Involvement:No   Subjective: Pt present for face-to-face individual therapy via video.  Pt consents to telehealth video session and is aware of limitations and benefits of virtual sessions.   Location of pt: home Location of therapist: home office.   Pt talked about her trip to [place].  She had a wonderful time.  She and her niece Elizabeth Murillo got along well.  Pt was able to walk 10 miles in [place] just 6 weeks after her hip replacement.   Pt is thrilled at how well her recovery from hip surgery is going.    Pt talked about feeling reluctant to look for houses in [place] .  Identified that one reason is because of what is going on in the country.  Pt states if things don't improve she may consider leaving the [place] .   Pt talked about feeling anxious about the political situation and state of the country.  Addressed pt's concerns. Worked on thought reframing.   Worked on calming strategies. 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  Pt feels angry and upset that he is  delaying her publishing date. Helped pt process her feelings.   Worked on self care strategies.   Provided supportive therapy.     Interventions: Cognitive Behavioral Therapy and Insight-Oriented  Diagnosis:  F43.23  Plan of Care: Recommend ongoing therapy.  Pt participated in setting treatment goals.  Pt wants to talk through life stressors.  She wants to figure out what her next project will be.  Pt wants help managing anxiety about politics.   Pt wants help [place]  Pt gets caught up in "what if" thinking about fears and wants to work on thought reframing.   Plan to meet every two weeks.  Pt agrees with treatment plan.    Treatment Plan Client Abilities/Strengths  Pt is bright, engaging, and motivated for therapy.  Client Treatment Preferences  Individual therapy.  Client Statement of Needs  Improve copings skills.  Addressed life stressors. Symptoms  Depressed or irritable mood. Excessive and/or unrealistic worry that is difficult to control occurring more days than not for at least 6 months about a number of events or activities. Hypervigilance (e.g., feeling constantly on edge, experiencing concentration difficulties, having trouble falling or staying asleep, exhibiting a general state of irritability). Low self-esteem. Problems Addressed  Unipolar Depression, Anxiety Goals 1. Alleviate depressive symptoms and return to previous level of effective functioning. 2. Appropriately grieve the loss in order to normalize mood and to return to previously adaptive level of functioning.  Objective Learn and implement behavioral strategies to overcome depression. Target Date: 2024-10-23 Frequency: Biweekly  Progress: 10 Modality: individual  Related Interventions Assist the client in developing skills that increase the likelihood of deriving pleasure from behavioral activation (e.g., assertiveness skills, developing an exercise plan, less internal/more external focus,  increased social involvement); reinforce success. Engage the client in "behavioral activation," increasing his/her activity level and contact with sources of reward, while identifying processes that inhibit activation. use behavioral techniques such as instruction, rehearsal, role-playing, role reversal, as needed, to facilitate activity in the client's daily life; reinforce success. 3. Develop healthy interpersonal relationships that lead to the alleviation and help prevent the relapse of depression. 4. Develop healthy thinking patterns and beliefs about self, others, and the world that lead to the alleviation and help prevent the relapse of depression. 5. Enhance ability to effectively cope with the full variety of life's worries and anxieties. 6. Learn and implement coping skills that result in a reduction of anxiety and worry, and improved daily functioning. Objective Learn and implement problem-solving strategies for realistically addressing worries. Target Date: 2024-10-23 Frequency: Biweekly  Progress: 10 Modality: individual  Related Interventions Assign the client a homework exercise in which he/she problem-solves a current problem (see Mastery of Your Anxiety and Worry: Workbook by Colbert Dates and Edna Gouty or Generalized Anxiety Disorder by Woodson He, and Edna Gouty); review, reinforce success, and provide corrective feedback toward improvement. Teach the client problem-solving strategies involving specifically defining a problem, generating options for addressing it, evaluating the pros and cons of each option, selecting and implementing an optional action, and reevaluating and refining the action. Objective Learn and implement calming skills to reduce overall anxiety and manage anxiety symptoms. Target Date: 2024-10-23 Frequency: Biweekly  Progress: 10 Modality: individual  Related Interventions Assign the client to read about progressive muscle relaxation and other calming strategies in  relevant books or treatment manuals (e.g., Progressive Relaxation Training by Juleen Oakland; Mastery of Your Anxiety and Worry: Workbook by Rodney Clamp). Assign the client homework each session in which he/she practices relaxation exercises daily, gradually applying them progressively from non-anxiety-provoking to anxiety-provoking situations; review and reinforce success while providing corrective feedback toward improvement. Teach the client calming/relaxation skills (e.g., applied relaxation, progressive muscle relaxation, cue controlled relaxation; mindful breathing; biofeedback) and how to discriminate better between relaxation and tension; teach the client how to apply these skills to his/her daily life. 7. Recognize, accept, and cope with feelings of depression. 8. Reduce overall frequency, intensity, and duration of the anxiety so that daily functioning is not impaired. 9. Resolve the core conflict that is the source of anxiety. 10. Stabilize anxiety level while increasing ability to function on a daily basis. Diagnosis F43.23  Willey Harrier, LCSW

## 2024-03-05 DIAGNOSIS — M25651 Stiffness of right hip, not elsewhere classified: Secondary | ICD-10-CM | POA: Diagnosis not present

## 2024-03-05 DIAGNOSIS — Z96641 Presence of right artificial hip joint: Secondary | ICD-10-CM | POA: Diagnosis not present

## 2024-03-14 ENCOUNTER — Ambulatory Visit: Admitting: Psychology

## 2024-03-14 DIAGNOSIS — F4323 Adjustment disorder with mixed anxiety and depressed mood: Secondary | ICD-10-CM

## 2024-03-14 NOTE — Progress Notes (Signed)
 [facility] Counselor/Therapist Progress Note  Patient ID: Elizabeth Murillo, MRN: [id number],    Date: 03/14/2024  Time Spent: 2:00pm-2:55pm   55 minutes   Treatment Type: Individual Therapy  Reported Symptoms: anxiety  Mental Status Exam: Appearance:  Casual     Behavior: Appropriate  Motor: Normal  Speech/Language:  Normal Rate  Affect: Appropriate  Mood: normal  Thought process: normal  Thought content:   WNL  Sensory/Perceptual disturbances:   WNL  Orientation: oriented to person, place, time/date, and situation  Attention: Good  Concentration: Good  Memory: WNL  Fund of knowledge:  Good  Insight:   Good  Judgment:  Good  Impulse Control: Good   Risk Assessment: Danger to Self:  No Self-injurious Behavior: No Danger to Others: No Duty to Warn:no Physical Aggression / Violence:No  Access to Firearms a concern: No  Gang Involvement:No   Subjective: Pt present for face-to-face individual therapy via video.  Pt consents to telehealth video session and is aware of limitations and benefits of virtual sessions.   Location of pt: home Location of therapist: home office.   Pt talked about thinking about moving to [place] and stay in her brother in law's house.  She is planning a road trip to [place] to explore if moving there is something she would be interested in.   Pt wants to have that option available in case things get worse in this country.   Pt talked about feeling anxious about the political situation and state of the country.  Addressed pt's concerns.  Pt is planning on attending some protests.  She states that being politically conscious is a part of her. Worked on thought reframing.   Worked on calming strategies. 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  Pt feels angry and upset that he is delaying her publishing date. Helped pt process her feelings.     Pt is also not feeling very motivated to work on the book or other projects.   She is feeling the retirement lack of focus unease.   Addressed how pt can organize her thoughts and interests so she can engage in a little at a time.    Pt can tend to be self critical.  Worked on self compassion.   Worked on self care strategies.   Provided supportive therapy.     Interventions: Cognitive Behavioral Therapy and Insight-Oriented  Diagnosis:  F43.23  Plan of Care: Recommend ongoing therapy.  Pt participated in setting treatment goals.  Pt wants to talk through life stressors.  She wants to figure out what her next project will be.  Pt wants help managing anxiety about politics.   Pt wants help with her decision to move [place].  Pt gets caught up in what if thinking about fears and wants to work on thought reframing.   Plan to meet every two weeks.  Pt agrees with treatment plan.    Treatment Plan Client Abilities/Strengths  Pt is bright, engaging, and motivated for therapy.  Client Treatment Preferences  Individual therapy.  Client Statement of Needs  Improve copings skills.  Addressed life stressors. Symptoms  Depressed or irritable mood. Excessive and/or unrealistic worry that is difficult to control occurring more days than not for at least 6 months about a number of events or activities. Hypervigilance (e.g., feeling constantly on edge, experiencing concentration difficulties, having trouble falling or staying asleep, exhibiting  a general state of irritability). Low self-esteem. Problems Addressed  Unipolar Depression, Anxiety Goals 1. Alleviate depressive symptoms and return to previous level of effective functioning. 2. Appropriately grieve the loss in order to normalize mood and to return to previously adaptive level of functioning. Objective Learn and implement behavioral strategies to overcome depression. Target Date: 2024-10-23 Frequency: Biweekly  Progress: 10 Modality:  individual  Related Interventions Assist the client in developing skills that increase the likelihood of deriving pleasure from behavioral activation (e.g., assertiveness skills, developing an exercise plan, less internal/more external focus, increased social involvement); reinforce success. Engage the client in behavioral activation, increasing his/her activity level and contact with sources of reward, while identifying processes that inhibit activation. use behavioral techniques such as instruction, rehearsal, role-playing, role reversal, as needed, to facilitate activity in the client's daily life; reinforce success. 3. Develop healthy interpersonal relationships that lead to the alleviation and help prevent the relapse of depression. 4. Develop healthy thinking patterns and beliefs about self, others, and the world that lead to the alleviation and help prevent the relapse of depression. 5. Enhance ability to effectively cope with the full variety of life's worries and anxieties. 6. Learn and implement coping skills that result in a reduction of anxiety and worry, and improved daily functioning. Objective Learn and implement problem-solving strategies for realistically addressing worries. Target Date: 2024-10-23 Frequency: Biweekly  Progress: 10 Modality: individual  Related Interventions Assign the client a homework exercise in which he/she problem-solves a current problem (see Mastery of Your Anxiety and Worry: Workbook by Colbert Dates and Edna Gouty or Generalized Anxiety Disorder by Woodson He, and Edna Gouty); review, reinforce success, and provide corrective feedback toward improvement. Teach the client problem-solving strategies involving specifically defining a problem, generating options for addressing it, evaluating the pros and cons of each option, selecting and implementing an optional action, and reevaluating and refining the action. Objective Learn and implement calming skills to reduce  overall anxiety and manage anxiety symptoms. Target Date: 2024-10-23 Frequency: Biweekly  Progress: 10 Modality: individual  Related Interventions Assign the client to read about progressive muscle relaxation and other calming strategies in relevant books or treatment manuals (e.g., Progressive Relaxation Training by Juleen Oakland; Mastery of Your Anxiety and Worry: Workbook by Rodney Clamp). Assign the client homework each session in which he/she practices relaxation exercises daily, gradually applying them progressively from non-anxiety-provoking to anxiety-provoking situations; review and reinforce success while providing corrective feedback toward improvement. Teach the client calming/relaxation skills (e.g., applied relaxation, progressive muscle relaxation, cue controlled relaxation; mindful breathing; biofeedback) and how to discriminate better between relaxation and tension; teach the client how to apply these skills to his/her daily life. 7. Recognize, accept, and cope with feelings of depression. 8. Reduce overall frequency, intensity, and duration of the anxiety so that daily functioning is not impaired. 9. Resolve the core conflict that is the source of anxiety. 10. Stabilize anxiety level while increasing ability to function on a daily basis. Diagnosis F43.23  Willey Harrier, LCSW

## 2024-03-22 DIAGNOSIS — L089 Local infection of the skin and subcutaneous tissue, unspecified: Secondary | ICD-10-CM | POA: Diagnosis not present

## 2024-03-25 DIAGNOSIS — L089 Local infection of the skin and subcutaneous tissue, unspecified: Secondary | ICD-10-CM | POA: Diagnosis not present

## 2024-03-25 DIAGNOSIS — M25651 Stiffness of right hip, not elsewhere classified: Secondary | ICD-10-CM | POA: Diagnosis not present

## 2024-03-25 DIAGNOSIS — T63481D Toxic effect of venom of other arthropod, accidental (unintentional), subsequent encounter: Secondary | ICD-10-CM | POA: Diagnosis not present

## 2024-03-28 ENCOUNTER — Ambulatory Visit: Admitting: Psychology

## 2024-03-28 DIAGNOSIS — F4323 Adjustment disorder with mixed anxiety and depressed mood: Secondary | ICD-10-CM

## 2024-03-28 NOTE — Progress Notes (Signed)
 [facility] Counselor/Therapist Progress Note  Patient ID: Elizabeth Murillo, MRN: [id number],    Date: 03/28/2024  Time Spent: 4:00pm-4:55pm   55 minutes   Treatment Type: Individual Therapy  Reported Symptoms: anxiety  Mental Status Exam: Appearance:  Casual     Behavior: Appropriate  Motor: Normal  Speech/Language:  Normal Rate  Affect: Appropriate  Mood: normal  Thought process: normal  Thought content:   WNL  Sensory/Perceptual disturbances:   WNL  Orientation: oriented to person, place, time/date, and situation  Attention: Good  Concentration: Good  Memory: WNL  Fund of knowledge:  Good  Insight:   Good  Judgment:  Good  Impulse Control: Good   Risk Assessment: Danger to Self:  No Self-injurious Behavior: No Danger to Others: No Duty to Warn:no Physical Aggression / Violence:No  Access to Firearms a concern: No  Gang Involvement:No   Subjective: Pt present for face-to-face individual therapy via video.  Pt consents to telehealth video session and is aware of limitations and benefits of virtual sessions.   Location of pt: home Location of therapist: home office.   Pt talked about today being the 4 year anniversary of her niece's death.  Her niece died at age 69 of ALS.  Helped pt process her feelings and grief.  Pt talked about feeling anxious about the political situation and state of the country.  Addressed pt's concerns.  She is worried about the war in [place].  Worked on thought reframing.   Worked on calming strategies. 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    Pt has done most of the work she needs to do to finish the book.  Pt has been judging herself about how long it has taken to complete her book.   Addressed how pt can be compassionate with herself.  Pt is traveling up [place] the next couple of months and will be looking at potential property  to buy.  She feels overwhelmed at times about the potential future move bc also has a sense of excitement.   Worked on self care strategies.   Provided supportive therapy.     Interventions: Cognitive Behavioral Therapy and Insight-Oriented  Diagnosis:  F43.23  Plan of Care: Recommend ongoing therapy.  Pt participated in setting treatment goals.  Pt wants to talk through life stressors.  She wants to figure out what her next project will be.  Pt wants help managing anxiety about politics.   Pt wants help with her decision to [place].  Pt gets caught up in what if thinking about fears and wants to work on thought reframing.   Plan to meet every two weeks.  Pt agrees with treatment plan.    Treatment Plan Client Abilities/Strengths  Pt is bright, engaging, and motivated for therapy.  Client Treatment Preferences  Individual therapy.  Client Statement of Needs  Improve copings skills.  Addressed life stressors. Symptoms  Depressed or irritable mood. Excessive and/or unrealistic worry that is difficult to control occurring more days than not for at least 6 months about a number of events or activities. Hypervigilance (e.g., feeling constantly on edge, experiencing concentration difficulties, having trouble falling or staying asleep, exhibiting a general state of irritability). Low self-esteem. Problems Addressed  Unipolar Depression, Anxiety Goals 1. Alleviate depressive symptoms and return to previous level of effective functioning. 2. Appropriately grieve the loss in order to normalize  mood and to return to previously adaptive level of functioning. Objective Learn and implement behavioral strategies to overcome depression. Target Date: 2024-10-23 Frequency: Biweekly  Progress: 10 Modality: individual  Related Interventions Assist the client in developing skills that increase the likelihood of deriving pleasure from behavioral activation (e.g., assertiveness skills, developing an  exercise plan, less internal/more external focus, increased social involvement); reinforce success. Engage the client in behavioral activation, increasing his/her activity level and contact with sources of reward, while identifying processes that inhibit activation. use behavioral techniques such as instruction, rehearsal, role-playing, role reversal, as needed, to facilitate activity in the client's daily life; reinforce success. 3. Develop healthy interpersonal relationships that lead to the alleviation and help prevent the relapse of depression. 4. Develop healthy thinking patterns and beliefs about self, others, and the world that lead to the alleviation and help prevent the relapse of depression. 5. Enhance ability to effectively cope with the full variety of life's worries and anxieties. 6. Learn and implement coping skills that result in a reduction of anxiety and worry, and improved daily functioning. Objective Learn and implement problem-solving strategies for realistically addressing worries. Target Date: 2024-10-23 Frequency: Biweekly  Progress: 10 Modality: individual  Related Interventions Assign the client a homework exercise in which he/she problem-solves a current problem (see Mastery of Your Anxiety and Worry: Workbook by Richarda and Jonne or Generalized Anxiety Disorder by Delores Filler, and Jonne); review, reinforce success, and provide corrective feedback toward improvement. Teach the client problem-solving strategies involving specifically defining a problem, generating options for addressing it, evaluating the pros and cons of each option, selecting and implementing an optional action, and reevaluating and refining the action. Objective Learn and implement calming skills to reduce overall anxiety and manage anxiety symptoms. Target Date: 2024-10-23 Frequency: Biweekly  Progress: 10 Modality: individual  Related Interventions Assign the client to read about progressive  muscle relaxation and other calming strategies in relevant books or treatment manuals (e.g., Progressive Relaxation Training by Thornell armin Collier; Mastery of Your Anxiety and Worry: Workbook by Richarda armin Jonne). Assign the client homework each session in which he/she practices relaxation exercises daily, gradually applying them progressively from non-anxiety-provoking to anxiety-provoking situations; review and reinforce success while providing corrective feedback toward improvement. Teach the client calming/relaxation skills (e.g., applied relaxation, progressive muscle relaxation, cue controlled relaxation; mindful breathing; biofeedback) and how to discriminate better between relaxation and tension; teach the client how to apply these skills to his/her daily life. 7. Recognize, accept, and cope with feelings of depression. 8. Reduce overall frequency, intensity, and duration of the anxiety so that daily functioning is not impaired. 9. Resolve the core conflict that is the source of anxiety. 10. Stabilize anxiety level while increasing ability to function on a daily basis. Diagnosis F43.23  Veva Alma, LCSW

## 2024-04-08 ENCOUNTER — Ambulatory Visit (INDEPENDENT_AMBULATORY_CARE_PROVIDER_SITE_OTHER): Admitting: Psychology

## 2024-04-08 DIAGNOSIS — F4323 Adjustment disorder with mixed anxiety and depressed mood: Secondary | ICD-10-CM | POA: Diagnosis not present
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
## 2024-04-15 DIAGNOSIS — M79672 Pain in left foot: Secondary | ICD-10-CM | POA: Diagnosis not present

## 2024-04-22 ENCOUNTER — Ambulatory Visit (INDEPENDENT_AMBULATORY_CARE_PROVIDER_SITE_OTHER): Admitting: Psychology

## 2024-04-22 DIAGNOSIS — F4323 Adjustment disorder with mixed anxiety and depressed mood: Secondary | ICD-10-CM
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
## 2024-05-06 ENCOUNTER — Ambulatory Visit (INDEPENDENT_AMBULATORY_CARE_PROVIDER_SITE_OTHER): Admitting: Psychology

## 2024-05-06 DIAGNOSIS — F4323 Adjustment disorder with mixed anxiety and depressed mood: Secondary | ICD-10-CM

## 2024-05-06 NOTE — Progress Notes (Signed)
 [facility] Counselor/Therapist Progress Note  Patient ID: Elizabeth Murillo, MRN: [id number],    Date: 05/06/2024  Time Spent: 4:00pm-4:50pm   50 minutes   Treatment Type: Individual Therapy  Reported Symptoms: anxiety  Mental Status Exam: Appearance:  Casual     Behavior: Appropriate  Motor: Normal  Speech/Language:  Normal Rate  Affect: Appropriate  Mood: normal  Thought process: normal  Thought content:   WNL  Sensory/Perceptual disturbances:   WNL  Orientation: oriented to person, place, time/date, and situation  Attention: Good  Concentration: Good  Memory: WNL  Fund of knowledge:  Good  Insight:   Good  Judgment:  Good  Impulse Control: Good   Risk Assessment: Danger to Self:  No Self-injurious Behavior: No Danger to Others: No Duty to Warn:no Physical Aggression / Violence:No  Access to Firearms a concern: No  Gang Involvement:No   Subjective: Pt present for face-to-face individual therapy via video.  Pt consents to telehealth video session and is aware of limitations and benefits of virtual sessions.   Location of pt: home Location of therapist: home office.   Pt talked about her travels up [place].  She has been able to connect with old friends.  Pt has been looking at houses up [place] and may consider moving to [place] .   Worked on decision analysis and addressed how this is a time of exploration. Pt talked about hearing from her Programmer, multimedia.  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  Pt is not as upset as she thought she would be and is trying not to take it personally.   Pt plans to meet with her editor this week and come up with a rebutal.   Pt talked about feeling anxious about the political situation and state of the country.  Addressed pt's concerns.  Worked on thought reframing.   Worked on calming strategies. Worked on self care strategies.   Provided supportive therapy.     Interventions: Cognitive  Behavioral Therapy and Insight-Oriented  Diagnosis:  F43.23  Plan of Care: Recommend ongoing therapy.  Pt participated in setting treatment goals.  Pt wants to talk through life stressors.  She wants to figure out what her next project will be.  Pt wants help managing anxiety about politics.   Pt wants help [place]  Pt gets caught up in what if thinking about fears and wants to work on thought reframing.   Plan to meet every two weeks.  Pt agrees with treatment plan.    Treatment Plan Client Abilities/Strengths  Pt is bright, engaging, and motivated for therapy.  Client Treatment Preferences  Individual therapy.  Client Statement of Needs  Improve copings skills.  Addressed life stressors. Symptoms  Depressed or irritable mood. Excessive and/or unrealistic worry that is difficult to control occurring more days than not for at least 6 months about a number of events or activities. Hypervigilance (e.g., feeling constantly on edge, experiencing concentration difficulties, having trouble falling or staying asleep, exhibiting a general state of irritability). Low self-esteem. Problems Addressed  Unipolar Depression, Anxiety Goals 1. Alleviate depressive symptoms and return to previous level of effective functioning. 2. Appropriately grieve the loss in order to normalize mood and to return to previously adaptive level of functioning. Objective Learn and implement behavioral strategies to overcome depression. Target Date: 2024-10-23 Frequency: Biweekly  Progress: 10 Modality: individual  Related Interventions Assist the client in developing skills that increase the likelihood of  deriving pleasure from behavioral activation (e.g., assertiveness skills, developing an exercise plan, less internal/more external focus, increased social involvement); reinforce success. Engage the client in behavioral activation, increasing his/her activity level and contact with sources of  reward, while identifying processes that inhibit activation. use behavioral techniques such as instruction, rehearsal, role-playing, role reversal, as needed, to facilitate activity in the client's daily life; reinforce success. 3. Develop healthy interpersonal relationships that lead to the alleviation and help prevent the relapse of depression. 4. Develop healthy thinking patterns and beliefs about self, others, and the world that lead to the alleviation and help prevent the relapse of depression. 5. Enhance ability to effectively cope with the full variety of life's worries and anxieties. 6. Learn and implement coping skills that result in a reduction of anxiety and worry, and improved daily functioning. Objective Learn and implement problem-solving strategies for realistically addressing worries. Target Date: 2024-10-23 Frequency: Biweekly  Progress: 10 Modality: individual  Related Interventions Assign the client a homework exercise in which he/she problem-solves a current problem (see Mastery of Your Anxiety and Worry: Workbook by Richarda and Jonne or Generalized Anxiety Disorder by Delores Filler, and Jonne); review, reinforce success, and provide corrective feedback toward improvement. Teach the client problem-solving strategies involving specifically defining a problem, generating options for addressing it, evaluating the pros and cons of each option, selecting and implementing an optional action, and reevaluating and refining the action. Objective Learn and implement calming skills to reduce overall anxiety and manage anxiety symptoms. Target Date: 2024-10-23 Frequency: Biweekly  Progress: 10 Modality: individual  Related Interventions Assign the client to read about progressive muscle relaxation and other calming strategies in relevant books or treatment manuals (e.g., Progressive Relaxation Training by Thornell armin Collier; Mastery of Your Anxiety and Worry: Workbook by Richarda armin Jonne). Assign the client homework each session in which he/she practices relaxation exercises daily, gradually applying them progressively from non-anxiety-provoking to anxiety-provoking situations; review and reinforce success while providing corrective feedback toward improvement. Teach the client calming/relaxation skills (e.g., applied relaxation, progressive muscle relaxation, cue controlled relaxation; mindful breathing; biofeedback) and how to discriminate better between relaxation and tension; teach the client how to apply these skills to his/her daily life. 7. Recognize, accept, and cope with feelings of depression. 8. Reduce overall frequency, intensity, and duration of the anxiety so that daily functioning is not impaired. 9. Resolve the core conflict that is the source of anxiety. 10. Stabilize anxiety level while increasing ability to function on a daily basis. Diagnosis F43.23  Veva Alma, LCSW

## 2024-05-24 ENCOUNTER — Ambulatory Visit: Admitting: Psychology

## 2024-06-06 DIAGNOSIS — L821 Other seborrheic keratosis: Secondary | ICD-10-CM | POA: Diagnosis not present

## 2024-06-06 DIAGNOSIS — Z808 Family history of malignant neoplasm of other organs or systems: Secondary | ICD-10-CM | POA: Diagnosis not present

## 2024-06-06 DIAGNOSIS — D2262 Melanocytic nevi of left upper limb, including shoulder: Secondary | ICD-10-CM | POA: Diagnosis not present

## 2024-06-06 DIAGNOSIS — L570 Actinic keratosis: Secondary | ICD-10-CM | POA: Diagnosis not present

## 2024-06-06 DIAGNOSIS — D229 Melanocytic nevi, unspecified: Secondary | ICD-10-CM | POA: Diagnosis not present

## 2024-06-06 DIAGNOSIS — Z85828 Personal history of other malignant neoplasm of skin: Secondary | ICD-10-CM | POA: Diagnosis not present

## 2024-06-06 DIAGNOSIS — L578 Other skin changes due to chronic exposure to nonionizing radiation: Secondary | ICD-10-CM | POA: Diagnosis not present

## 2024-06-07 ENCOUNTER — Ambulatory Visit: Admitting: Psychology

## 2024-06-07 DIAGNOSIS — F4323 Adjustment disorder with mixed anxiety and depressed mood: Secondary | ICD-10-CM | POA: Diagnosis not present

## 2024-06-07 NOTE — Progress Notes (Signed)
 [facility] Counselor/Therapist Progress Note  Patient ID: Elizabeth Murillo, MRN: [id number],    Date: 06/07/2024  Time Spent: 4:00pm-4:50pm   50 minutes   Treatment Type: Individual Therapy  Reported Symptoms: anxiety  Mental Status Exam: Appearance:  Casual     Behavior: Appropriate  Motor: Normal  Speech/Language:  Normal Rate  Affect: Appropriate  Mood: normal  Thought process: normal  Thought content:   WNL  Sensory/Perceptual disturbances:   WNL  Orientation: oriented to person, place, time/date, and situation  Attention: Good  Concentration: Good  Memory: WNL  Fund of knowledge:  Good  Insight:   Good  Judgment:  Good  Impulse Control: Good   Risk Assessment: Danger to Self:  No Self-injurious Behavior: No Danger to Others: No Duty to Warn:no Physical Aggression / Violence:No  Access to Firearms a concern: No  Gang Involvement:No   Subjective: Pt present for face-to-face individual therapy via video.  Pt consents to telehealth video session and is aware of limitations and benefits of virtual sessions.   Location of pt: home Location of therapist: home office.   Pt talked about recovering from her travels and being glad to be back home.   Pt feels very grateful for the experiences she had.    Pt is very grateful that her hip is doing well.   She is also grateful for the time she had with friends.   Pt has made her decision about where she wants to move.  She plans to move to [place]  near [place].  It feels good to pt to have clarity about her future plans.   Pt states she has realized that she feels guilty that she has the privilege of relocating and buying a house.  She feels like she does not deserve it.  Pt is privileged financially and feels badly about that bc so many people do not have as much as she does.   Helped pt process her thoughts and feelings.   She plans to volunteer and be a good steward of her resources to help others.   Pt talked  about meeting with her editor.  The editor said they will no longer engage with reader number 2 but pt has to addressed the feedback the reader gave.   Pt is frustrated with the process but wants to get the work done as soon as possible.   Pt talked about feeling anxious about the political situation and state of the country.  Addressed pt's concerns.  Worked on thought reframing.   Worked on calming strategies. Pt addressed her need to work toward balance in her life.  Worked on self care strategies.   Provided supportive therapy.     Interventions: Cognitive Behavioral Therapy and Insight-Oriented  Diagnosis:  F43.23  Plan of Care: Recommend ongoing therapy.  Pt participated in setting treatment goals.  Pt wants to talk through life stressors.  She wants to figure out what her next project will be.  Pt wants help managing anxiety about politics.   Pt wants help with her decision to [place].  Pt gets caught up in what if thinking about fears and wants to work on thought reframing.   Plan to meet every two weeks.  Pt agrees with treatment plan.    Treatment Plan Client Abilities/Strengths  Pt is bright, engaging, and motivated for therapy.  Client Treatment Preferences  Individual therapy.  Client Statement of Needs  Improve copings skills.  Addressed life stressors. Symptoms  Depressed or irritable mood. Excessive and/or unrealistic worry that is difficult to control occurring more days than not for at least 6 months about a number of events or activities. Hypervigilance (e.g., feeling constantly on edge, experiencing concentration difficulties, having trouble falling or staying asleep, exhibiting a general state of irritability). Low self-esteem. Problems Addressed  Unipolar Depression, Anxiety Goals 1. Alleviate depressive symptoms and return to previous level of effective functioning. 2. Appropriately grieve the loss in order to normalize mood and to return to previously adaptive  level of functioning. Objective Learn and implement behavioral strategies to overcome depression. Target Date: 2024-10-23 Frequency: Biweekly  Progress: 10 Modality: individual  Related Interventions Assist the client in developing skills that increase the likelihood of deriving pleasure from behavioral activation (e.g., assertiveness skills, developing an exercise plan, less internal/more external focus, increased social involvement); reinforce success. Engage the client in behavioral activation, increasing his/her activity level and contact with sources of reward, while identifying processes that inhibit activation. use behavioral techniques such as instruction, rehearsal, role-playing, role reversal, as needed, to facilitate activity in the client's daily life; reinforce success. 3. Develop healthy interpersonal relationships that lead to the alleviation and help prevent the relapse of depression. 4. Develop healthy thinking patterns and beliefs about self, others, and the world that lead to the alleviation and help prevent the relapse of depression. 5. Enhance ability to effectively cope with the full variety of life's worries and anxieties. 6. Learn and implement coping skills that result in a reduction of anxiety and worry, and improved daily functioning. Objective Learn and implement problem-solving strategies for realistically addressing worries. Target Date: 2024-10-23 Frequency: Biweekly  Progress: 10 Modality: individual  Related Interventions Assign the client a homework exercise in which he/she problem-solves a current problem (see Mastery of Your Anxiety and Worry: Workbook by Richarda and Jonne or Generalized Anxiety Disorder by Delores Filler, and Jonne); review, reinforce success, and provide corrective feedback toward improvement. Teach the client problem-solving strategies involving specifically defining a problem, generating options for addressing it, evaluating the pros and  cons of each option, selecting and implementing an optional action, and reevaluating and refining the action. Objective Learn and implement calming skills to reduce overall anxiety and manage anxiety symptoms. Target Date: 2024-10-23 Frequency: Biweekly  Progress: 10 Modality: individual  Related Interventions Assign the client to read about progressive muscle relaxation and other calming strategies in relevant books or treatment manuals (e.g., Progressive Relaxation Training by Thornell armin Collier; Mastery of Your Anxiety and Worry: Workbook by Richarda armin Jonne). Assign the client homework each session in which he/she practices relaxation exercises daily, gradually applying them progressively from non-anxiety-provoking to anxiety-provoking situations; review and reinforce success while providing corrective feedback toward improvement. Teach the client calming/relaxation skills (e.g., applied relaxation, progressive muscle relaxation, cue controlled relaxation; mindful breathing; biofeedback) and how to discriminate better between relaxation and tension; teach the client how to apply these skills to his/her daily life. 7. Recognize, accept, and cope with feelings of depression. 8. Reduce overall frequency, intensity, and duration of the anxiety so that daily functioning is not impaired. 9. Resolve the core conflict that is the source of anxiety. 10. Stabilize anxiety level while increasing ability to function on a daily basis. Diagnosis F43.23  Veva Alma, LCSW

## 2024-06-12 DIAGNOSIS — Z1231 Encounter for screening mammogram for malignant neoplasm of breast: Secondary | ICD-10-CM | POA: Diagnosis not present

## 2024-06-21 ENCOUNTER — Ambulatory Visit: Admitting: Psychology

## 2024-06-21 DIAGNOSIS — F4323 Adjustment disorder with mixed anxiety and depressed mood: Secondary | ICD-10-CM
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
## 2024-07-05 ENCOUNTER — Ambulatory Visit: Admitting: Psychology

## 2024-07-05 DIAGNOSIS — F4323 Adjustment disorder with mixed anxiety and depressed mood: Secondary | ICD-10-CM | POA: Diagnosis not present

## 2024-07-05 NOTE — Progress Notes (Signed)
  [facility] Counselor/Therapist Progress Note  Patient ID: Elizabeth Murillo, MRN: [id number],    Date: 07/05/2024  Time Spent: 3:00pm-3:50pm   50 minutes   Treatment Type: Individual Therapy  Reported Symptoms: anxiety  Mental Status Exam: Appearance:  Casual     Behavior: Appropriate  Motor: Normal  Speech/Language:  Normal Rate  Affect: Appropriate  Mood: normal  Thought process: normal  Thought content:   WNL  Sensory/Perceptual disturbances:   WNL  Orientation: oriented to person, place, time/date, and situation  Attention: Good  Concentration: Good  Memory: WNL  Fund of knowledge:  Good  Insight:   Good  Judgment:  Good  Impulse Control: Good   Risk Assessment: Danger to Self:  No Self-injurious Behavior: No Danger to Others: No Duty to Warn:no Physical Aggression / Violence:No  Access to Firearms a concern: No  Gang Involvement:No   Subjective: Pt present for face-to-face individual therapy via video.  Pt consents to telehealth video session and is aware of limitations and benefits of virtual sessions.   Location of pt: home Location of therapist: home office.   Pt talked about being upset about politics and the state of the country.  She is planning to go to a protest this month. 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  Pt is proud of herself for getting this done.  Pt met with her financial planner who was helpful and reassuring to pt about her finances.  Pt has connected with a realtor in [place]  to begin to look at houses.  Pt has also talked with her brother who is a Veterinary surgeon but is challenging to deal with.  Pt will go north in two weeks to meet the realtor and look at houses.  Pt is also meeting a a realtor next week about putting her house on the market.  Pt loves her house and has to work on letting go of it which is emotional for her.  Helped pt process her feelings.   Pt is planning a trip to  [place] in November.  Pt is excited but overwhelmed about all the upcoming changes in her life.  Worked on Optician, dispensing.  Worked on self care strategies.   Provided supportive therapy.     Interventions: Cognitive Behavioral Therapy and Insight-Oriented  Diagnosis:  F43.23  Plan of Care: Recommend ongoing therapy.  Pt participated in setting treatment goals.  Pt wants to talk through life stressors.  She wants to figure out what her next project will be.  Pt wants help managing anxiety about politics.   Pt wants help with her decision to move north.  Pt gets caught up in what if thinking about fears and wants to work on thought reframing.   Plan to meet every two weeks.  Pt agrees with treatment plan.    Treatment Plan Client Abilities/Strengths  Pt is bright, engaging, and motivated for therapy.  Client Treatment Preferences  Individual therapy.  Client Statement of Needs  Improve copings skills.  Addressed life stressors. Symptoms  Depressed or irritable mood. Excessive and/or unrealistic worry that is difficult to control occurring more days than not for at least 6 months about a number of events or activities. Hypervigilance (e.g., feeling constantly on edge, experiencing concentration difficulties, having trouble falling or staying asleep, exhibiting a general state of irritability). Low self-esteem. Problems Addressed  Unipolar Depression, Anxiety Goals 1. Alleviate depressive symptoms and return to previous level of effective  functioning. 2. Appropriately grieve the loss in order to normalize mood and to return to previously adaptive level of functioning. Objective Learn and implement behavioral strategies to overcome depression. Target Date: 2024-10-23 Frequency: Biweekly  Progress: 10 Modality: individual  Related Interventions Assist the client in developing skills that increase the likelihood of deriving pleasure from behavioral activation (e.g., assertiveness skills,  developing an exercise plan, less internal/more external focus, increased social involvement); reinforce success. Engage the client in behavioral activation, increasing his/her activity level and contact with sources of reward, while identifying processes that inhibit activation. use behavioral techniques such as instruction, rehearsal, role-playing, role reversal, as needed, to facilitate activity in the client's daily life; reinforce success. 3. Develop healthy interpersonal relationships that lead to the alleviation and help prevent the relapse of depression. 4. Develop healthy thinking patterns and beliefs about self, others, and the world that lead to the alleviation and help prevent the relapse of depression. 5. Enhance ability to effectively cope with the full variety of life's worries and anxieties. 6. Learn and implement coping skills that result in a reduction of anxiety and worry, and improved daily functioning. Objective Learn and implement problem-solving strategies for realistically addressing worries. Target Date: 2024-10-23 Frequency: Biweekly  Progress: 10 Modality: individual  Related Interventions Assign the client a homework exercise in which he/she problem-solves a current problem (see Mastery of Your Anxiety and Worry: Workbook by Richarda and Jonne or Generalized Anxiety Disorder by Delores Filler, and Jonne); review, reinforce success, and provide corrective feedback toward improvement. Teach the client problem-solving strategies involving specifically defining a problem, generating options for addressing it, evaluating the pros and cons of each option, selecting and implementing an optional action, and reevaluating and refining the action. Objective Learn and implement calming skills to reduce overall anxiety and manage anxiety symptoms. Target Date: 2024-10-23 Frequency: Biweekly  Progress: 10 Modality: individual  Related Interventions Assign the client to read about  progressive muscle relaxation and other calming strategies in relevant books or treatment manuals (e.g., Progressive Relaxation Training by Thornell armin Collier; Mastery of Your Anxiety and Worry: Workbook by Richarda armin Jonne). Assign the client homework each session in which he/she practices relaxation exercises daily, gradually applying them progressively from non-anxiety-provoking to anxiety-provoking situations; review and reinforce success while providing corrective feedback toward improvement. Teach the client calming/relaxation skills (e.g., applied relaxation, progressive muscle relaxation, cue controlled relaxation; mindful breathing; biofeedback) and how to discriminate better between relaxation and tension; teach the client how to apply these skills to his/her daily life. 7. Recognize, accept, and cope with feelings of depression. 8. Reduce overall frequency, intensity, and duration of the anxiety so that daily functioning is not impaired. 9. Resolve the core conflict that is the source of anxiety. 10. Stabilize anxiety level while increasing ability to function on a daily basis. Diagnosis F43.23  Veva Alma, LCSW

## 2024-07-22 ENCOUNTER — Ambulatory Visit: Admitting: Psychology

## 2024-08-02 ENCOUNTER — Ambulatory Visit: Admitting: Psychology

## 2024-08-02 DIAGNOSIS — F4323 Adjustment disorder with mixed anxiety and depressed mood: Secondary | ICD-10-CM

## 2024-08-02 NOTE — Progress Notes (Signed)
 [facility] Counselor/Therapist Progress Note  Patient ID: Elizabeth Murillo, MRN: [id number],    Date: 08/02/2024  Time Spent: 4:00pm-4:50pm   50 minutes   Treatment Type: Individual Therapy  Reported Symptoms: anxiety  Mental Status Exam: Appearance:  Casual     Behavior: Appropriate  Motor: Normal  Speech/Language:  Normal Rate  Affect: Appropriate  Mood: normal  Thought process: normal  Thought content:   WNL  Sensory/Perceptual disturbances:   WNL  Orientation: oriented to person, place, time/date, and situation  Attention: Good  Concentration: Good  Memory: WNL  Fund of knowledge:  Good  Insight:   Good  Judgment:  Good  Impulse Control: Good   Risk Assessment: Danger to Self:  No Self-injurious Behavior: No Danger to Others: No Duty to Warn:no Physical Aggression / Violence:No  Access to Firearms a concern: No  Gang Involvement:No   Subjective: Pt present for face-to-face individual therapy via video.  Pt consents to telehealth video session and is aware of limitations and benefits of virtual sessions.   Location of pt: home Location of therapist: home office.   Pt talked about feeling very vulnerable.   Pt has done a big purge and clean out of her house.   She cried a lot as she let go of things.   She traveled to [place]  to look at houses and did not find a house yet but saw friends and family so it was a positive trip.   Pt's current house was painted in preparation for sale while she was in [place] .  Now it does not feel like her house which is disorienting for her.    Pt is having anticipatory grief about leaving her home and moving.   Pt feels exhausted emotionally.   Pt states when she is tired and stressed she tends to get self critical and deprecating.  Identified that some of this is fear based and has it's roots in childhood issues.   Addressed the issues and worked on how pt can be gentle with herself and tell herself that she is seen and  heard and ask herself what she needs.   Pt is going to [place] next week for a week.   Pt is excited but overwhelmed about all the upcoming changes in her life.  Worked on optician, dispensing.  Worked on self care strategies.   Provided supportive therapy.     Interventions: Cognitive Behavioral Therapy and Insight-Oriented  Diagnosis:  F43.23  Plan of Care: Recommend ongoing therapy.  Pt participated in setting treatment goals.  Pt wants to talk through life stressors.  She wants to figure out what her next project will be.  Pt wants help managing anxiety about politics.   Pt wants help with her decision to move north.  Pt gets caught up in what if thinking about fears and wants to work on thought reframing.   Plan to meet every two weeks.  Pt agrees with treatment plan.    Treatment Plan Client Abilities/Strengths  Pt is bright, engaging, and motivated for therapy.  Client Treatment Preferences  Individual therapy.  Client Statement of Needs  Improve copings skills.  Addressed life stressors. Symptoms  Depressed or irritable mood. Excessive and/or unrealistic worry that is difficult to control occurring more days than not for at least 6 months about a number of events or activities. Hypervigilance (e.g., feeling constantly on edge, experiencing concentration difficulties, having trouble falling or staying asleep, exhibiting a general state of irritability). Low  self-esteem. Problems Addressed  Unipolar Depression, Anxiety Goals 1. Alleviate depressive symptoms and return to previous level of effective functioning. 2. Appropriately grieve the loss in order to normalize mood and to return to previously adaptive level of functioning. Objective Learn and implement behavioral strategies to overcome depression. Target Date: 2024-10-23 Frequency: Biweekly  Progress: 10 Modality: individual  Related Interventions Assist the client in developing skills that increase the likelihood of deriving  pleasure from behavioral activation (e.g., assertiveness skills, developing an exercise plan, less internal/more external focus, increased social involvement); reinforce success. Engage the client in behavioral activation, increasing his/her activity level and contact with sources of reward, while identifying processes that inhibit activation. use behavioral techniques such as instruction, rehearsal, role-playing, role reversal, as needed, to facilitate activity in the client's daily life; reinforce success. 3. Develop healthy interpersonal relationships that lead to the alleviation and help prevent the relapse of depression. 4. Develop healthy thinking patterns and beliefs about self, others, and the world that lead to the alleviation and help prevent the relapse of depression. 5. Enhance ability to effectively cope with the full variety of life's worries and anxieties. 6. Learn and implement coping skills that result in a reduction of anxiety and worry, and improved daily functioning. Objective Learn and implement problem-solving strategies for realistically addressing worries. Target Date: 2024-10-23 Frequency: Biweekly  Progress: 10 Modality: individual  Related Interventions Assign the client a homework exercise in which he/she problem-solves a current problem (see Mastery of Your Anxiety and Worry: Workbook by Richarda and Jonne or Generalized Anxiety Disorder by Delores Filler, and Jonne); review, reinforce success, and provide corrective feedback toward improvement. Teach the client problem-solving strategies involving specifically defining a problem, generating options for addressing it, evaluating the pros and cons of each option, selecting and implementing an optional action, and reevaluating and refining the action. Objective Learn and implement calming skills to reduce overall anxiety and manage anxiety symptoms. Target Date: 2024-10-23 Frequency: Biweekly  Progress: 10 Modality:  individual  Related Interventions Assign the client to read about progressive muscle relaxation and other calming strategies in relevant books or treatment manuals (e.g., Progressive Relaxation Training by Thornell armin Collier; Mastery of Your Anxiety and Worry: Workbook by Richarda armin Jonne). Assign the client homework each session in which he/she practices relaxation exercises daily, gradually applying them progressively from non-anxiety-provoking to anxiety-provoking situations; review and reinforce success while providing corrective feedback toward improvement. Teach the client calming/relaxation skills (e.g., applied relaxation, progressive muscle relaxation, cue controlled relaxation; mindful breathing; biofeedback) and how to discriminate better between relaxation and tension; teach the client how to apply these skills to his/her daily life. 7. Recognize, accept, and cope with feelings of depression. 8. Reduce overall frequency, intensity, and duration of the anxiety so that daily functioning is not impaired. 9. Resolve the core conflict that is the source of anxiety. 10. Stabilize anxiety level while increasing ability to function on a daily basis. Diagnosis F43.23  Veva Alma, LCSW

## 2024-08-12 ENCOUNTER — Ambulatory Visit: Admitting: Psychology

## 2024-08-26 ENCOUNTER — Ambulatory Visit: Admitting: Psychology

## 2024-08-26 DIAGNOSIS — F4323 Adjustment disorder with mixed anxiety and depressed mood: Secondary | ICD-10-CM | POA: Diagnosis not present

## 2024-08-26 NOTE — Progress Notes (Signed)
 [facility] Counselor/Therapist Progress Note  Patient ID: Elizabeth Murillo, MRN: [id number],    Date: 08/26/2024  Time Spent: 3:00pm-3:55pm   55 minutes   Treatment Type: Individual Therapy  Reported Symptoms: anxiety  Mental Status Exam: Appearance:  Casual     Behavior: Appropriate  Motor: Normal  Speech/Language:  Normal Rate  Affect: Appropriate  Mood: normal  Thought process: normal  Thought content:   WNL  Sensory/Perceptual disturbances:   WNL  Orientation: oriented to person, place, time/date, and situation  Attention: Good  Concentration: Good  Memory: WNL  Fund of knowledge:  Good  Insight:   Good  Judgment:  Good  Impulse Control: Good   Risk Assessment: Danger to Self:  No Self-injurious Behavior: No Danger to Others: No Duty to Warn:no Physical Aggression / Violence:No  Access to Firearms a concern: No  Gang Involvement:No   Subjective: Pt present for face-to-face individual therapy via video.  Pt consents to telehealth video session and is aware of limitations and benefits of virtual sessions.   Location of pt: home Location of therapist: home office.   Pt talked about her trip to [place].  She had a very good trip.  Pt stayed with a friend and had a good connection with her. Pt figured out that she needs to sell her house before she can buy a house so she is focusing on getting her house on the market.  Pt is narrowing her search for a house to [place], [place] .  She is glad she is getting more clarity.  Pt talked about the holidays.  She will travel to [place] for Thanksgiving.  She is looking forward to the trip.   Pt is focusing on meditation, swimming, yoga to help her be patient during this time of waiting and change.  Pt attended the memorial service for her neighbor recently.   This reminded her about how fleeting life can be.  Helped pt process her thoughts and feelings.  Pt has been thinking about feeling guilty about being so  privileged.  Addressed how she can use gratitude to let go of guilt.  Pt is excited but overwhelmed about all the upcoming changes in her life.  Worked on optician, dispensing.  Worked on self care strategies.   Provided supportive therapy.     Interventions: Cognitive Behavioral Therapy and Insight-Oriented  Diagnosis:  F43.23  Plan of Care: Recommend ongoing therapy.  Pt participated in setting treatment goals.  Pt wants to talk through life stressors.  She wants to figure out what her next project will be.  Pt wants help managing anxiety about politics.   Pt wants help with her decision to move north.  Pt gets caught up in what if thinking about fears and wants to work on thought reframing.   Plan to meet every two weeks.  Pt agrees with treatment plan.    Treatment Plan Client Abilities/Strengths  Pt is bright, engaging, and motivated for therapy.  Client Treatment Preferences  Individual therapy.  Client Statement of Needs  Improve copings skills.  Addressed life stressors. Symptoms  Depressed or irritable mood. Excessive and/or unrealistic worry that is difficult to control occurring more days than not for at least 6 months about a number of events or activities. Hypervigilance (e.g., feeling constantly on edge, experiencing concentration difficulties, having trouble falling or staying asleep, exhibiting a general state of irritability). Low self-esteem. Problems Addressed  Unipolar Depression, Anxiety Goals 1. Alleviate depressive symptoms and return to previous  level of effective functioning. 2. Appropriately grieve the loss in order to normalize mood and to return to previously adaptive level of functioning. Objective Learn and implement behavioral strategies to overcome depression. Target Date: 2024-10-23 Frequency: Biweekly  Progress: 10 Modality: individual  Related Interventions Assist the client in developing skills that increase the likelihood of deriving pleasure from  behavioral activation (e.g., assertiveness skills, developing an exercise plan, less internal/more external focus, increased social involvement); reinforce success. Engage the client in behavioral activation, increasing his/her activity level and contact with sources of reward, while identifying processes that inhibit activation. use behavioral techniques such as instruction, rehearsal, role-playing, role reversal, as needed, to facilitate activity in the client's daily life; reinforce success. 3. Develop healthy interpersonal relationships that lead to the alleviation and help prevent the relapse of depression. 4. Develop healthy thinking patterns and beliefs about self, others, and the world that lead to the alleviation and help prevent the relapse of depression. 5. Enhance ability to effectively cope with the full variety of life's worries and anxieties. 6. Learn and implement coping skills that result in a reduction of anxiety and worry, and improved daily functioning. Objective Learn and implement problem-solving strategies for realistically addressing worries. Target Date: 2024-10-23 Frequency: Biweekly  Progress: 10 Modality: individual  Related Interventions Assign the client a homework exercise in which he/she problem-solves a current problem (see Mastery of Your Anxiety and Worry: Workbook by Richarda and Jonne or Generalized Anxiety Disorder by Delores Filler, and Jonne); review, reinforce success, and provide corrective feedback toward improvement. Teach the client problem-solving strategies involving specifically defining a problem, generating options for addressing it, evaluating the pros and cons of each option, selecting and implementing an optional action, and reevaluating and refining the action. Objective Learn and implement calming skills to reduce overall anxiety and manage anxiety symptoms. Target Date: 2024-10-23 Frequency: Biweekly  Progress: 10 Modality: individual   Related Interventions Assign the client to read about progressive muscle relaxation and other calming strategies in relevant books or treatment manuals (e.g., Progressive Relaxation Training by Thornell armin Collier; Mastery of Your Anxiety and Worry: Workbook by Richarda armin Jonne). Assign the client homework each session in which he/she practices relaxation exercises daily, gradually applying them progressively from non-anxiety-provoking to anxiety-provoking situations; review and reinforce success while providing corrective feedback toward improvement. Teach the client calming/relaxation skills (e.g., applied relaxation, progressive muscle relaxation, cue controlled relaxation; mindful breathing; biofeedback) and how to discriminate better between relaxation and tension; teach the client how to apply these skills to his/her daily life. 7. Recognize, accept, and cope with feelings of depression. 8. Reduce overall frequency, intensity, and duration of the anxiety so that daily functioning is not impaired. 9. Resolve the core conflict that is the source of anxiety. 10. Stabilize anxiety level while increasing ability to function on a daily basis. Diagnosis F43.23  Veva Alma, LCSW

## 2024-09-20 ENCOUNTER — Ambulatory Visit (INDEPENDENT_AMBULATORY_CARE_PROVIDER_SITE_OTHER): Admitting: Psychology

## 2024-09-20 DIAGNOSIS — F4323 Adjustment disorder with mixed anxiety and depressed mood: Secondary | ICD-10-CM

## 2024-09-20 NOTE — Progress Notes (Signed)
 "   [facility] Counselor/Therapist Progress Note  Patient ID: Elizabeth Murillo, MRN: [id number],    Date: 09/20/2024  Time Spent: 4:00pm-4:55pm   55 minutes   Treatment Type: Individual Therapy  Reported Symptoms: anxiety  Mental Status Exam: Appearance:  Casual     Behavior: Appropriate  Motor: Normal  Speech/Language:  Normal Rate  Affect: Appropriate  Mood: normal  Thought process: normal  Thought content:   WNL  Sensory/Perceptual disturbances:   WNL  Orientation: oriented to person, place, time/date, and situation  Attention: Good  Concentration: Good  Memory: WNL  Fund of knowledge:  Good  Insight:   Good  Judgment:  Good  Impulse Control: Good   Risk Assessment: Danger to Self:  No Self-injurious Behavior: No Danger to Others: No Duty to Warn:no Physical Aggression / Violence:No  Access to Firearms a concern: No  Gang Involvement:No   Subjective: Pt present for face-to-face individual therapy via video.  Pt consents to telehealth video session and is aware of limitations and benefits of virtual sessions.   Location of pt: home Location of therapist: home office.   Pt talked about putting her house on the market.  She is working on finding a house in [place] .   It has been a challenging process.  Pt wants to live in [place]  but it is expensive to live there.  Pt is going to look at condos bc she may not be able to afford a house.  Pt feels the process is very emotional.  Helped pt process her thoughts and feelings.   [place] and will be visiting family and looking at homes for sale.  There are some stressful family dynamics especially with her brother.  Helped pt process the family dynamics and how she can handle them. Pt is focusing on meditation, swimming, yoga to help her be patient during this time of waiting and change.  Worked on self care strategies.   Provided supportive therapy.      Interventions: Cognitive Behavioral Therapy and Insight-Oriented  Diagnosis:  F43.23  Plan of Care: Recommend ongoing therapy.  Pt participated in setting treatment goals.  Pt wants to talk through life stressors.  She wants to figure out what her next project will be.  Pt wants help managing anxiety about politics.   Pt wants help with her decision to move north.  Pt gets caught up in what if thinking about fears and wants to work on thought reframing.   Plan to meet every two weeks.  Pt agrees with treatment plan.    Treatment Plan Client Abilities/Strengths  Pt is bright, engaging, and motivated for therapy.  Client Treatment Preferences  Individual therapy.  Client Statement of Needs  Improve copings skills.  Addressed life stressors. Symptoms  Depressed or irritable mood. Excessive and/or unrealistic worry that is difficult to control occurring more days than not for at least 6 months about a number of events or activities. Hypervigilance (e.g., feeling constantly on edge, experiencing concentration difficulties, having trouble falling or staying asleep, exhibiting a general state of irritability). Low self-esteem. Problems Addressed  Unipolar Depression, Anxiety Goals 1. Alleviate depressive symptoms and return to previous level of effective functioning. 2. Appropriately grieve the loss in order to normalize mood and to return to previously adaptive level of functioning. Objective Learn and implement behavioral strategies to overcome depression. Target Date: 2024-10-23 Frequency: Biweekly  Progress: 10 Modality: individual  Related Interventions Assist the  client in developing skills that increase the likelihood of deriving pleasure from behavioral activation (e.g., assertiveness skills, developing an exercise plan, less internal/more external focus, increased social involvement); reinforce success. Engage the client in behavioral activation, increasing his/her activity  level and contact with sources of reward, while identifying processes that inhibit activation. use behavioral techniques such as instruction, rehearsal, role-playing, role reversal, as needed, to facilitate activity in the client's daily life; reinforce success. 3. Develop healthy interpersonal relationships that lead to the alleviation and help prevent the relapse of depression. 4. Develop healthy thinking patterns and beliefs about self, others, and the world that lead to the alleviation and help prevent the relapse of depression. 5. Enhance ability to effectively cope with the full variety of life's worries and anxieties. 6. Learn and implement coping skills that result in a reduction of anxiety and worry, and improved daily functioning. Objective Learn and implement problem-solving strategies for realistically addressing worries. Target Date: 2024-10-23 Frequency: Biweekly  Progress: 10 Modality: individual  Related Interventions Assign the client a homework exercise in which he/she problem-solves a current problem (see Mastery of Your Anxiety and Worry: Workbook by Richarda and Jonne or Generalized Anxiety Disorder by Delores Filler, and Jonne); review, reinforce success, and provide corrective feedback toward improvement. Teach the client problem-solving strategies involving specifically defining a problem, generating options for addressing it, evaluating the pros and cons of each option, selecting and implementing an optional action, and reevaluating and refining the action. Objective Learn and implement calming skills to reduce overall anxiety and manage anxiety symptoms. Target Date: 2024-10-23 Frequency: Biweekly  Progress: 10 Modality: individual  Related Interventions Assign the client to read about progressive muscle relaxation and other calming strategies in relevant books or treatment manuals (e.g., Progressive Relaxation Training by Thornell armin Collier; Mastery of Your Anxiety and  Worry: Workbook by Richarda armin Jonne). Assign the client homework each session in which he/she practices relaxation exercises daily, gradually applying them progressively from non-anxiety-provoking to anxiety-provoking situations; review and reinforce success while providing corrective feedback toward improvement. Teach the client calming/relaxation skills (e.g., applied relaxation, progressive muscle relaxation, cue controlled relaxation; mindful breathing; biofeedback) and how to discriminate better between relaxation and tension; teach the client how to apply these skills to his/her daily life. 7. Recognize, accept, and cope with feelings of depression. 8. Reduce overall frequency, intensity, and duration of the anxiety so that daily functioning is not impaired. 9. Resolve the core conflict that is the source of anxiety. 10. Stabilize anxiety level while increasing ability to function on a daily basis. Diagnosis F43.23  Veva Alma, LCSW    "

## 2024-10-07 ENCOUNTER — Ambulatory Visit (INDEPENDENT_AMBULATORY_CARE_PROVIDER_SITE_OTHER): Admitting: Psychology

## 2024-10-07 DIAGNOSIS — F4323 Adjustment disorder with mixed anxiety and depressed mood: Secondary | ICD-10-CM
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
## 2024-10-21 ENCOUNTER — Ambulatory Visit: Admitting: Psychology

## 2024-10-30 ENCOUNTER — Ambulatory Visit: Admitting: Psychology

## 2024-10-30 DIAGNOSIS — F4323 Adjustment disorder with mixed anxiety and depressed mood: Secondary | ICD-10-CM | POA: Diagnosis not present
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
## 2024-11-12 ENCOUNTER — Ambulatory Visit: Admitting: Psychology

## 2024-11-26 ENCOUNTER — Ambulatory Visit: Admitting: Psychology
